# Patient Record
Sex: Female | Born: 1970 | Race: White | Hispanic: No | Marital: Married | Smoking: Current every day smoker
Health system: Southern US, Community
[De-identification: ages and names within clinical notes are randomized; demographics above are authoritative.]

## PROBLEM LIST (undated history)

## (undated) DIAGNOSIS — F419 Anxiety disorder, unspecified: Secondary | ICD-10-CM

## (undated) DIAGNOSIS — F32A Depression, unspecified: Secondary | ICD-10-CM

## (undated) DIAGNOSIS — N83209 Unspecified ovarian cyst, unspecified side: Secondary | ICD-10-CM

## (undated) DIAGNOSIS — F329 Major depressive disorder, single episode, unspecified: Secondary | ICD-10-CM

## (undated) DIAGNOSIS — B009 Herpesviral infection, unspecified: Secondary | ICD-10-CM

## (undated) HISTORY — DX: Unspecified ovarian cyst, unspecified side: N83.209

## (undated) HISTORY — DX: Major depressive disorder, single episode, unspecified: F32.9

## (undated) HISTORY — PX: BREAST BIOPSY: SHX20

## (undated) HISTORY — DX: Herpesviral infection, unspecified: B00.9

## (undated) HISTORY — DX: Depression, unspecified: F32.A

## (undated) HISTORY — DX: Anxiety disorder, unspecified: F41.9

---

## 2001-03-19 ENCOUNTER — Other Ambulatory Visit: Admission: RE | Admit: 2001-03-19 | Discharge: 2001-03-19 | Payer: Self-pay | Admitting: Gynecology

## 2002-05-12 ENCOUNTER — Other Ambulatory Visit: Admission: RE | Admit: 2002-05-12 | Discharge: 2002-05-12 | Payer: Self-pay | Admitting: Gynecology

## 2003-12-03 ENCOUNTER — Other Ambulatory Visit: Admission: RE | Admit: 2003-12-03 | Discharge: 2003-12-03 | Payer: Self-pay | Admitting: Gynecology

## 2004-12-07 ENCOUNTER — Other Ambulatory Visit: Admission: RE | Admit: 2004-12-07 | Discharge: 2004-12-07 | Payer: Self-pay | Admitting: Gynecology

## 2007-04-02 ENCOUNTER — Other Ambulatory Visit: Admission: RE | Admit: 2007-04-02 | Discharge: 2007-04-02 | Payer: Self-pay | Admitting: Gynecology

## 2008-08-06 ENCOUNTER — Other Ambulatory Visit: Admission: RE | Admit: 2008-08-06 | Discharge: 2008-08-06 | Payer: Self-pay | Admitting: Gynecology

## 2008-08-06 ENCOUNTER — Encounter: Payer: Self-pay | Admitting: Gynecology

## 2008-08-06 ENCOUNTER — Ambulatory Visit: Payer: Self-pay | Admitting: Gynecology

## 2008-08-31 ENCOUNTER — Encounter: Admission: RE | Admit: 2008-08-31 | Discharge: 2008-08-31 | Payer: Self-pay | Admitting: Internal Medicine

## 2009-01-18 ENCOUNTER — Encounter: Admission: RE | Admit: 2009-01-18 | Discharge: 2009-01-18 | Payer: Self-pay | Admitting: Internal Medicine

## 2010-08-04 ENCOUNTER — Other Ambulatory Visit (HOSPITAL_COMMUNITY)
Admission: RE | Admit: 2010-08-04 | Discharge: 2010-08-04 | Disposition: A | Discharge status: Home / Self Care | Payer: BC Managed Care – PPO | Source: Ambulatory Visit | Attending: Gynecology | Admitting: Gynecology

## 2010-08-04 ENCOUNTER — Encounter (INDEPENDENT_AMBULATORY_CARE_PROVIDER_SITE_OTHER): Payer: BC Managed Care – PPO | Admitting: Gynecology

## 2010-08-04 ENCOUNTER — Other Ambulatory Visit: Payer: Self-pay | Admitting: Gynecology

## 2010-08-04 DIAGNOSIS — Z01419 Encounter for gynecological examination (general) (routine) without abnormal findings: Secondary | ICD-10-CM

## 2010-08-04 DIAGNOSIS — R82998 Other abnormal findings in urine: Secondary | ICD-10-CM

## 2010-08-04 DIAGNOSIS — Z13220 Encounter for screening for lipoid disorders: Secondary | ICD-10-CM

## 2010-08-04 DIAGNOSIS — Z124 Encounter for screening for malignant neoplasm of cervix: Secondary | ICD-10-CM | POA: Insufficient documentation

## 2010-08-04 DIAGNOSIS — Z833 Family history of diabetes mellitus: Secondary | ICD-10-CM

## 2010-12-05 ENCOUNTER — Ambulatory Visit (INDEPENDENT_AMBULATORY_CARE_PROVIDER_SITE_OTHER): Payer: BC Managed Care – PPO | Admitting: Gynecology

## 2010-12-05 DIAGNOSIS — N644 Mastodynia: Secondary | ICD-10-CM

## 2011-07-13 ENCOUNTER — Ambulatory Visit (INDEPENDENT_AMBULATORY_CARE_PROVIDER_SITE_OTHER): Payer: BC Managed Care – PPO | Admitting: Gynecology

## 2011-07-13 ENCOUNTER — Encounter: Payer: Self-pay | Admitting: Gynecology

## 2011-07-13 DIAGNOSIS — F419 Anxiety disorder, unspecified: Secondary | ICD-10-CM

## 2011-07-13 DIAGNOSIS — N390 Urinary tract infection, site not specified: Secondary | ICD-10-CM

## 2011-07-13 DIAGNOSIS — R300 Dysuria: Secondary | ICD-10-CM

## 2011-07-13 DIAGNOSIS — E569 Vitamin deficiency, unspecified: Secondary | ICD-10-CM

## 2011-07-13 DIAGNOSIS — F411 Generalized anxiety disorder: Secondary | ICD-10-CM

## 2011-07-13 LAB — URINALYSIS W MICROSCOPIC + REFLEX CULTURE
Bilirubin Urine: NEGATIVE
Crystals: NONE SEEN
Glucose, UA: NEGATIVE mg/dL
Specific Gravity, Urine: 1.015 (ref 1.005–1.030)
Urobilinogen, UA: 0.2 mg/dL (ref 0.0–1.0)

## 2011-07-13 MED ORDER — NITROFURANTOIN MONOHYD MACRO 100 MG PO CAPS: 100.0000 mg | ORAL_CAPSULE | Freq: Two times a day (BID) | ORAL | Status: AC

## 2011-07-13 NOTE — Patient Instructions (Signed)
Take antibiotics as prescribed. Follow up if symptoms persist.

## 2011-07-13 NOTE — Progress Notes (Signed)
Patient presents with several days of worsening urinary frequency, dysuria and suprapubic discomfort. No fevers chills or low back pain. No other constitutional symptoms such as nausea vomiting diarrhea constipation.  Exam Spine straight without CVA tenderness. Abdomen soft nontender without masses guarding rebound organomegaly  Assessment and plan: Symptoms and urinalysis consistent with UTI. We'll treat with Macrobid 100 mg twice a day x7 days follow up if symptoms persist or recur. Packed patients actually due for her annual has it scheduled next month she'll follow up for that.

## 2011-07-16 LAB — URINE CULTURE

## 2011-08-14 ENCOUNTER — Ambulatory Visit (INDEPENDENT_AMBULATORY_CARE_PROVIDER_SITE_OTHER): Payer: BC Managed Care – PPO | Admitting: Gynecology

## 2011-08-14 ENCOUNTER — Encounter: Payer: Self-pay | Admitting: Gynecology

## 2011-08-14 VITALS — BP 124/70 | Ht 67.5 in | Wt 169.0 lb

## 2011-08-14 DIAGNOSIS — Z131 Encounter for screening for diabetes mellitus: Secondary | ICD-10-CM

## 2011-08-14 DIAGNOSIS — Z13220 Encounter for screening for lipoid disorders: Secondary | ICD-10-CM

## 2011-08-14 DIAGNOSIS — Z01419 Encounter for gynecological examination (general) (routine) without abnormal findings: Secondary | ICD-10-CM

## 2011-08-14 LAB — CBC WITH DIFFERENTIAL/PLATELET
Basophils Relative: 0 % (ref 0–1)
HCT: 39.3 % (ref 36.0–46.0)
Hemoglobin: 13.1 g/dL (ref 12.0–15.0)
Lymphocytes Relative: 29 % (ref 12–46)
Lymphs Abs: 2.4 10*3/uL (ref 0.7–4.0)
MCHC: 33.3 g/dL (ref 30.0–36.0)
Monocytes Absolute: 0.4 10*3/uL (ref 0.1–1.0)
Monocytes Relative: 5 % (ref 3–12)
Neutro Abs: 5.4 10*3/uL (ref 1.7–7.7)
RBC: 4.06 MIL/uL (ref 3.87–5.11)

## 2011-08-14 LAB — GLUCOSE, RANDOM: Glucose, Bld: 85 mg/dL (ref 70–99)

## 2011-08-14 LAB — LIPID PANEL
LDL Cholesterol: 151 mg/dL — ABNORMAL HIGH (ref 0–99)
VLDL: 13 mg/dL (ref 0–40)

## 2011-08-14 NOTE — Patient Instructions (Addendum)
Follow up with me if you want to discuss more reliable contraception. Return in one year for your annual gynecologic exam. Try to stop smoking.     Smoking Cessation This document explains the best ways for you to quit smoking and new treatments to help. It lists new medicines that can double or triple your chances of quitting and quitting for good. It also considers ways to avoid relapses and concerns you may have about quitting, including weight gain. NICOTINE: A POWERFUL ADDICTION If you have tried to quit smoking, you know how hard it can be. It is hard because nicotine is a very addictive drug. For some people, it can be as addictive as heroin or cocaine. Usually, people make 2 or 3 tries, or more, before finally being able to quit. Each time you try to quit, you can learn about what helps and what hurts. Quitting takes hard work and a lot of effort, but you can quit smoking. QUITTING SMOKING IS ONE OF THE MOST IMPORTANT THINGS YOU WILL EVER DO.  You will live longer, feel better, and live better.   The impact on your body of quitting smoking is felt almost immediately:   Within 20 minutes, blood pressure decreases. Pulse returns to its normal level.   After 8 hours, carbon monoxide levels in the blood return to normal. Oxygen level increases.   After 24 hours, chance of heart attack starts to decrease. Breath, hair, and body stop smelling like smoke.   After 48 hours, damaged nerve endings begin to recover. Sense of taste and smell improve.   After 72 hours, the body is virtually free of nicotine. Bronchial tubes relax and breathing becomes easier.   After 2 to 12 weeks, lungs can hold more air. Exercise becomes easier and circulation improves.   Quitting will reduce your risk of having a heart attack, stroke, cancer, or lung disease:   After 1 year, the risk of coronary heart disease is cut in half.   After 5 years, the risk of stroke falls to the same as a nonsmoker.    After 10 years, the risk of lung cancer is cut in half and the risk of other cancers decreases significantly.   After 15 years, the risk of coronary heart disease drops, usually to the level of a nonsmoker.   If you are pregnant, quitting smoking will improve your chances of having a healthy baby.   The people you live with, especially your children, will be healthier.   You will have extra money to spend on things other than cigarettes.  FIVE KEYS TO QUITTING Studies have shown that these 5 steps will help you quit smoking and quit for good. You have the best chances of quitting if you use them together: 1. Get ready.  2. Get support and encouragement.  3. Learn new skills and behaviors.  4. Get medicine to reduce your nicotine addiction and use it correctly.  5. Be prepared for relapse or difficult situations. Be determined to continue trying to quit, even if you do not succeed at first.  1. GET READY  Set a quit date.   Change your environment.   Get rid of ALL cigarettes, ashtrays, matches, and lighters in your home, car, and place of work.   Do not let people smoke in your home.   Review your past attempts to quit. Think about what worked and what did not.   Once you quit, do not smoke. NOT EVEN A PUFF!  2.  GET SUPPORT AND ENCOURAGEMENT Studies have shown that you have a better chance of being successful if you have help. You can get support in many ways.  Tell your family, friends, and coworkers that you are going to quit and need their support. Ask them not to smoke around you.   Talk to your caregivers (doctor, dentist, nurse, pharmacist, psychologist, and/or smoking counselor).   Get individual, group, or telephone counseling and support. The more counseling you have, the better your chances are of quitting. Programs are available at [employer] and health centers. Call your local health department for information about programs in your area.   Spiritual beliefs  and practices may help some smokers quit.   Quit meters are Photographer that keep track of quit statistics, such as amount of "quit-time," cigarettes not smoked, and money saved.   Many smokers find one or more of the many self-help books available useful in helping them quit and stay off tobacco.  3. LEARN NEW SKILLS AND BEHAVIORS  Try to distract yourself from urges to smoke. Talk to someone, go for a walk, or occupy your time with a task.   When you first try to quit, change your routine. Take a different route to work. Drink tea instead of coffee. Eat breakfast in a different place.   Do something to reduce your stress. Take a hot bath, exercise, or read a book.   Plan something enjoyable to do every day. Reward yourself for not smoking.   Explore interactive web-based programs that specialize in helping you quit.  4. GET MEDICINE AND USE IT CORRECTLY Medicines can help you stop smoking and decrease the urge to smoke. Combining medicine with the above behavioral methods and support can quadruple your chances of successfully quitting smoking. The [place] Food and Drug Administration (FDA) has approved 7 medicines to help you quit smoking. These medicines fall into 3 categories.  Nicotine replacement therapy (delivers nicotine to your body without the negative effects and risks of smoking):   Nicotine gum: Available over-the-counter.   Nicotine lozenges: Available over-the-counter.   Nicotine inhaler: Available by prescription.   Nicotine nasal spray: Available by prescription.   Nicotine skin patches (transdermal): Available by prescription and over-the-counter.   Antidepressant medicine (helps people abstain from smoking, but how this works is unknown):   Bupropion sustained-release (SR) tablets: Available by prescription.   Nicotinic receptor partial agonist (simulates the effect of nicotine in your brain):   Varenicline tartrate tablets:  Available by prescription.   Ask your caregiver for advice about which medicines to use and how to use them. Carefully read the information on the package.   Everyone who is trying to quit may benefit from using a medicine. If you are pregnant or trying to become pregnant, nursing an infant, you are under age 44, or you smoke fewer than 10 cigarettes per day, talk to your caregiver before taking any nicotine replacement medicines.   You should stop using a nicotine replacement product and call your caregiver if you experience nausea, dizziness, weakness, vomiting, fast or irregular heartbeat, mouth problems with the lozenge or gum, or redness or swelling of the skin around the patch that does not go away.   Do not use any other product containing nicotine while using a nicotine replacement product.   Talk to your caregiver before using these products if you have diabetes, heart disease, asthma, stomach ulcers, you had a recent heart attack, you have high blood pressure  that is not controlled with medicine, a history of irregular heartbeat, or you have been prescribed medicine to help you quit smoking.  5. BE PREPARED FOR RELAPSE OR DIFFICULT SITUATIONS  Most relapses occur within the first 3 months after quitting. Do not be discouraged if you start smoking again. Remember, most people try several times before they finally quit.   You may have symptoms of withdrawal because your body is used to nicotine. You may crave cigarettes, be irritable, feel very hungry, cough often, get headaches, or have difficulty concentrating.   The withdrawal symptoms are only temporary. They are strongest when you first quit, but they will go away within 10 to 14 days.  Here are some difficult situations to watch for:  Alcohol. Avoid drinking alcohol. Drinking lowers your chances of successfully quitting.   Caffeine. Try to reduce the amount of caffeine you consume. It also lowers your chances of successfully  quitting.   Other smokers. Being around smoking can make you want to smoke. Avoid smokers.   Weight gain. Many smokers will gain weight when they quit, usually less than 10 pounds. Eat a healthy diet and stay active. Do not let weight gain distract you from your main goal, quitting smoking. Some medicines that help you quit smoking may also help delay weight gain. You can always lose the weight gained after you quit.   Bad mood or depression. There are a lot of ways to improve your mood other than smoking.  If you are having problems with any of these situations, talk to your caregiver. SPECIAL SITUATIONS AND CONDITIONS Studies suggest that everyone can quit smoking. Your situation or condition can give you a special reason to quit.  Pregnant women/new mothers: By quitting, you protect your baby's health and your own.   Hospitalized patients: By quitting, you reduce health problems and help healing.   Heart attack patients: By quitting, you reduce your risk of a second heart attack.   Lung, head, and neck cancer patients: By quitting, you reduce your chance of a second cancer.   Parents of children and adolescents: By quitting, you protect your children from illnesses caused by secondhand smoke.  QUESTIONS TO THINK ABOUT Think about the following questions before you try to stop smoking. You may want to talk about your answers with your caregiver.  Why do you want to quit?   If you tried to quit in the past, what helped and what did not?   What will be the most difficult situations for you after you quit? How will you plan to handle them?   Who can help you through the tough times? Your family? Friends? Caregiver?   What pleasures do you get from smoking? What ways can you still get pleasure if you quit?  Here are some questions to ask your caregiver:  How can you help me to be successful at quitting?   What medicine do you think would be best for me and how should I take it?    What should I do if I need more help?   What is smoking withdrawal like? How can I get information on withdrawal?  Quitting takes hard work and a lot of effort, but you can quit smoking. FOR MORE INFORMATION  Smokefree.gov (http://www.davis-sullivan.com/) provides free, accurate, evidence-based information and professional assistance to help support the immediate and long-term needs of people trying to quit smoking. Document Released: 05/08/2001 Document Revised: 05/03/2011 Document Reviewed: 02/28/2009 [facility] Patient Information 2012 [place], [place].

## 2011-08-14 NOTE — Progress Notes (Signed)
Connie Fox 1971-01-27 [id number]        41 y.o.  for annual exam.  Several issues that are below.  Past medical history,surgical history, medications, allergies, family history and social history were all reviewed and documented in the EPIC chart. ROS:  Was performed and pertinent positives and negatives are included in the history.  Exam: Connie Fox chaperone present Filed Vitals:   08/14/11 0932  BP: 124/70   General appearance  Normal Skin grossly normal Head/Neck normal with no cervical or supraclavicular adenopathy thyroid normal Lungs  clear Cardiac RR, without RMG Abdominal  soft, nontender, without masses, organomegaly or hernia Breasts  examined lying and sitting without masses, retractions, discharge or axillary adenopathy. Pelvic  Ext/BUS/vagina  normal   Cervix  normal  Uterus  anteverted, normal size, shape and contour, midline and mobile nontender   Adnexa  Without masses or tenderness    Anus and perineum  normal   Rectovaginal  normal sphincter tone without palpated masses or tenderness.    Assessment/Plan:  41 y.o. female for annual exam.   Regular menses, sexually active with no active birth control. 1. Contraception. I again discussed contraceptive options with her. The issues of pregnancy in 41 year old have been reviewed. She is comfortable not actively using contraception would accept pregnancy if it occurs. 2. Breast health. Does have a maternal history of breast cancer. Due for mammogram now and she knows to schedule this and agrees to do so and continue with annual mammography. SBE monthly reviewed. 3. Pap smear. She has no history of significant abnormal Pap smears before. No Pap smear was done today. She has multiple normal records in her chart with her last Pap 2012. We'll plan on every three-year screening per current screening guidelines and she agrees with this. 4. Smoking. I again discussed stop smoking with her. I reviewed various strategies with her and she  understands my recommendations. We discussed assistance such as medication and she declines at this time. 5. Health maintenance. Baseline CBC glucose lipid profile and urinalysis were ordered. Assuming she continues well from a gynecologic standpoint she will see me in a year, sooner as needed.    Connie Lords MD, 10:23 AM 08/14/2011

## 2011-08-15 ENCOUNTER — Other Ambulatory Visit: Payer: Self-pay | Admitting: *Deleted

## 2011-08-15 DIAGNOSIS — E7800 Pure hypercholesterolemia, unspecified: Secondary | ICD-10-CM

## 2011-08-15 LAB — URINALYSIS W MICROSCOPIC + REFLEX CULTURE
Bilirubin Urine: NEGATIVE
Casts: NONE SEEN
Crystals: NONE SEEN
Glucose, UA: NEGATIVE mg/dL
Leukocytes, UA: NEGATIVE
Specific Gravity, Urine: 1.021 (ref 1.005–1.030)
Squamous Epithelial / LPF: NONE SEEN
pH: 5.5 (ref 5.0–8.0)

## 2011-08-20 ENCOUNTER — Ambulatory Visit: Payer: BC Managed Care – PPO

## 2011-08-20 DIAGNOSIS — E7800 Pure hypercholesterolemia, unspecified: Secondary | ICD-10-CM

## 2011-08-21 LAB — LIPID PANEL
HDL: 46 mg/dL (ref >39)
LDL Cholesterol: 134 mg/dL — ABNORMAL HIGH (ref 0–99)
Triglycerides: 73 mg/dL (ref <150)

## 2012-02-08 ENCOUNTER — Encounter: Payer: Self-pay | Admitting: Gynecology

## 2012-02-12 ENCOUNTER — Encounter: Payer: Self-pay | Admitting: Gynecology

## 2012-05-02 ENCOUNTER — Ambulatory Visit (INDEPENDENT_AMBULATORY_CARE_PROVIDER_SITE_OTHER): Payer: BC Managed Care – PPO | Admitting: Gynecology

## 2012-05-02 ENCOUNTER — Encounter: Payer: Self-pay | Admitting: Gynecology

## 2012-05-02 DIAGNOSIS — N926 Irregular menstruation, unspecified: Secondary | ICD-10-CM

## 2012-05-02 DIAGNOSIS — E7800 Pure hypercholesterolemia, unspecified: Secondary | ICD-10-CM

## 2012-05-02 LAB — HCG, SERUM, QUALITATIVE: Preg, Serum: NEGATIVE

## 2012-05-02 LAB — CBC WITH DIFFERENTIAL/PLATELET
Basophils Relative: 1 % (ref 0–1)
Eosinophils Absolute: 0.1 10*3/uL (ref 0.0–0.7)
Hemoglobin: 13.3 g/dL (ref 12.0–15.0)
Lymphs Abs: 2.5 10*3/uL (ref 0.7–4.0)
MCH: 33.5 pg (ref 26.0–34.0)
Neutrophils Relative %: 65 % (ref 43–77)
Platelets: 298 10*3/uL (ref 150–400)

## 2012-05-02 LAB — LIPID PANEL
LDL Cholesterol: 124 mg/dL — ABNORMAL HIGH (ref 0–99)
Triglycerides: 66 mg/dL (ref <150)

## 2012-05-02 NOTE — Patient Instructions (Signed)
Follow up for ultrasound as scheduled 

## 2012-05-02 NOTE — Progress Notes (Signed)
Patient presents having regular menses until October when her period was 5-6 days late. She then started a regular flow and then has bled on and off since then. No cramping or pain. Did receive a steroid injection in the shoulder. No weight gain hair or skin changes or galactorrhea. Not contracepting and sexually active.  Exam was [name] assistant Abdomen soft nontender without masses guarding rebound organomegaly. Pelvic external BUS vagina normal. Cervix normal. Uterus normal size midline mobile nontender. Adnexa without masses or tenderness.  Assessment and plan: Irregular menses x1 month. Check baseline labs to include CBC hCG and TSH.  Follow up for sonohysterogram. Various possibilities to include hormonal dysfunction, structural abnormalities such as polyps/myomas as well as pregnancy related events all reviewed.

## 2012-05-14 ENCOUNTER — Encounter: Payer: Self-pay | Admitting: Gynecology

## 2012-05-14 ENCOUNTER — Ambulatory Visit (INDEPENDENT_AMBULATORY_CARE_PROVIDER_SITE_OTHER): Payer: BC Managed Care – PPO | Admitting: Gynecology

## 2012-05-14 ENCOUNTER — Ambulatory Visit: Payer: BC Managed Care – PPO | Admitting: Gynecology

## 2012-05-14 ENCOUNTER — Other Ambulatory Visit: Payer: BC Managed Care – PPO

## 2012-05-14 ENCOUNTER — Ambulatory Visit (INDEPENDENT_AMBULATORY_CARE_PROVIDER_SITE_OTHER): Payer: BC Managed Care – PPO

## 2012-05-14 DIAGNOSIS — N926 Irregular menstruation, unspecified: Secondary | ICD-10-CM

## 2012-05-14 DIAGNOSIS — N920 Excessive and frequent menstruation with regular cycle: Secondary | ICD-10-CM

## 2012-05-14 DIAGNOSIS — D259 Leiomyoma of uterus, unspecified: Secondary | ICD-10-CM

## 2012-05-14 DIAGNOSIS — D251 Intramural leiomyoma of uterus: Secondary | ICD-10-CM

## 2012-05-14 DIAGNOSIS — N83209 Unspecified ovarian cyst, unspecified side: Secondary | ICD-10-CM

## 2012-05-14 DIAGNOSIS — O34519 Maternal care for incarceration of gravid uterus, unspecified trimester: Secondary | ICD-10-CM

## 2012-05-14 DIAGNOSIS — N854 Malposition of uterus: Secondary | ICD-10-CM

## 2012-05-14 NOTE — Patient Instructions (Signed)
Office will call you with the biopsy results. Keep a menstrual calendar. If significant irregularity call me. Otherwise follow up for ultrasound in 2-3 months to follow up on your ovarian cyst.

## 2012-05-14 NOTE — Progress Notes (Signed)
Patient presents for sonohysterogram due to irregular menses.  History of regular menses until October when her period was 5-6 days late. She then started a regular flow and then has bled on and off since then. No cramping or pain. Did receive a steroid injection in the shoulder. No weight gain hair or skin changes or galactorrhea. Not contracepting and sexually active. Lab work includes a negative hCG normal TSH and hemoglobin of 13.  Ultrasound shows multiple small myomas largest 15 mm. Overall uterine size normal. Endometrial echo 19 mm. Right ovary with avascular 33 mm mean cystic area. Left ovary normal. Cul-de-sac negative. Sonohysterogram performed, sterile technique, easy catheter introduction, good distention with no abnormalities. Endometrial sample taken. Patient tolerated well.  Assessment and plan: 1. Small leiomyoma. Discussed with patient and I think these are asymptomatic and we will plan on continuing to monitor. 2. Right ovarian cyst. 33 mm with a cyst within a cyst appearance. Avascular. Discussed with patient probably physiologic. Did recommend follow up ultrasound in 2-3 months to make sure it resolves. Review possibilities to include surgery if it persists or enlarges. 3. Irregular menses. I think this is probably hormonal related. Possibly due to the cyst. Follow up on biopsy. Assuming negative than will keep menstrual calendar and we'll see how she does. If irregular bleeding continues then consider hormone manipulation.

## 2012-05-28 HISTORY — PX: AUGMENTATION MAMMAPLASTY: SUR837

## 2012-07-12 ENCOUNTER — Other Ambulatory Visit: Payer: Self-pay

## 2012-07-28 ENCOUNTER — Encounter: Payer: Self-pay | Admitting: Gynecology

## 2012-07-28 ENCOUNTER — Ambulatory Visit (INDEPENDENT_AMBULATORY_CARE_PROVIDER_SITE_OTHER): Payer: BC Managed Care – PPO | Admitting: Gynecology

## 2012-07-28 ENCOUNTER — Ambulatory Visit (INDEPENDENT_AMBULATORY_CARE_PROVIDER_SITE_OTHER): Payer: BC Managed Care – PPO

## 2012-07-28 DIAGNOSIS — N83209 Unspecified ovarian cyst, unspecified side: Secondary | ICD-10-CM

## 2012-07-28 NOTE — Progress Notes (Signed)
Patient also for ultrasound. Had an episode of irregular bleeding which she now notes that her menses are regular and a 34 mm simple cyst on the right ovary 05/14/2012. I asked her to come back in several months for re\re ultrasound and make sure this resolves and to followup on her menstrual history.  Ultrasound today is normal. She still has a small leiomyoma stable. Endometrial echo 7.8 mm. Right left ovaries visualized and normal with no evidence of the prior cyst.  Assessment and plan: Irregular menses now regular, small right ovarian cyst now resolved. Patient due for her annual exam now and I asked her to schedule this over the next several months in followup and she agrees to do so.

## 2012-07-28 NOTE — Patient Instructions (Signed)
Followup for annual exam over the next several months

## 2012-08-08 ENCOUNTER — Other Ambulatory Visit: Payer: Self-pay | Admitting: *Deleted

## 2012-08-08 DIAGNOSIS — R921 Mammographic calcification found on diagnostic imaging of breast: Secondary | ICD-10-CM

## 2012-08-20 ENCOUNTER — Other Ambulatory Visit: Payer: Self-pay | Admitting: *Deleted

## 2012-08-20 DIAGNOSIS — R897 Abnormal histological findings in specimens from other organs, systems and tissues: Secondary | ICD-10-CM

## 2012-08-21 ENCOUNTER — Other Ambulatory Visit: Payer: Self-pay | Admitting: Gynecology

## 2012-08-21 DIAGNOSIS — R921 Mammographic calcification found on diagnostic imaging of breast: Secondary | ICD-10-CM

## 2012-08-25 ENCOUNTER — Other Ambulatory Visit: Payer: Self-pay | Admitting: Radiology

## 2012-08-26 ENCOUNTER — Other Ambulatory Visit: Payer: Self-pay | Admitting: *Deleted

## 2012-08-26 DIAGNOSIS — R897 Abnormal histological findings in specimens from other organs, systems and tissues: Secondary | ICD-10-CM

## 2013-02-18 ENCOUNTER — Encounter: Payer: Self-pay | Admitting: Gynecology

## 2013-03-17 ENCOUNTER — Ambulatory Visit (INDEPENDENT_AMBULATORY_CARE_PROVIDER_SITE_OTHER): Payer: BC Managed Care – PPO | Admitting: Gynecology

## 2013-03-17 ENCOUNTER — Encounter: Payer: Self-pay | Admitting: Gynecology

## 2013-03-17 VITALS — BP 144/84 | Ht 67.0 in | Wt 174.0 lb

## 2013-03-17 DIAGNOSIS — Z309 Encounter for contraceptive management, unspecified: Secondary | ICD-10-CM

## 2013-03-17 DIAGNOSIS — Z01419 Encounter for gynecological examination (general) (routine) without abnormal findings: Secondary | ICD-10-CM

## 2013-03-17 DIAGNOSIS — Z13220 Encounter for screening for lipoid disorders: Secondary | ICD-10-CM

## 2013-03-17 LAB — LIPID PANEL
HDL: 49 mg/dL (ref >39)
Triglycerides: 96 mg/dL (ref <150)

## 2013-03-17 LAB — CBC WITH DIFFERENTIAL/PLATELET
HCT: 37.8 % (ref 36.0–46.0)
Hemoglobin: 12.8 g/dL (ref 12.0–15.0)
Lymphocytes Relative: 34 % (ref 12–46)
Lymphs Abs: 3.3 10*3/uL (ref 0.7–4.0)
Monocytes Absolute: 0.7 10*3/uL (ref 0.1–1.0)
Monocytes Relative: 7 % (ref 3–12)
Neutro Abs: 5.7 10*3/uL (ref 1.7–7.7)
RBC: 3.88 MIL/uL (ref 3.87–5.11)
WBC: 9.7 10*3/uL (ref 4.0–10.5)

## 2013-03-17 LAB — COMPREHENSIVE METABOLIC PANEL
Albumin: 4.3 g/dL (ref 3.5–5.2)
BUN: 10 mg/dL (ref 6–23)
Calcium: 9.2 mg/dL (ref 8.4–10.5)
Chloride: 102 mEq/L (ref 96–112)
Glucose, Bld: 84 mg/dL (ref 70–99)
Potassium: 3.8 mEq/L (ref 3.5–5.3)
Total Protein: 7.1 g/dL (ref 6.0–8.3)

## 2013-03-17 NOTE — Progress Notes (Signed)
Connie Fox [birth year] [id number]        42 y.o.  G2P0020 for annual exam.  Doing well without complaints.  Past medical history,surgical history, medications, allergies, family history and social history were all reviewed and documented in the EPIC chart.  ROS:  Performed and pertinent positives and negatives are included in the history, assessment and plan .  Exam: Kim assistant Filed Vitals:   03/17/13 1155  BP: 144/84  Height: 5\' 7"  (1.702 m)  Weight: 174 lb (78.926 kg)   General appearance  Normal Skin grossly normal Head/Neck normal with no cervical or supraclavicular adenopathy thyroid normal Lungs  clear Cardiac RR, without RMG Abdominal  soft, nontender, without masses, organomegaly or hernia Breasts  examined lying and sitting without masses, retractions, discharge or axillary adenopathy. Pelvic  Ext/BUS/vagina  normal  Cervix  normal  Uterus  anteverted, normal size, shape and contour, midline and mobile nontender   Adnexa  Without masses or tenderness    Anus and perineum  normal   Rectovaginal  normal sphincter tone without palpated masses or tenderness.    Assessment/Plan:  42 y.o. G19P0020 female for annual exam, regular menses, no contraception.  1. Contraceptive management. Reviewed contraceptive options to include progesterone only such as Depo-Provera and nexplanon. Mirena IUD as well as other IUDs discussed. I reviewed was involved with Mirena IUD placement and the risks to include perforation/migration requiring surgery to remove, infection, failure with pregnancy, hormonal side effects. Patient's going to think about it and followup if she decides. Otherwise if she continues without contraception she understands and accepts the failure risk with pregnancy. 2. Mammography 01/2013. Mother with postmenopausal breast cancer. Continue with annual mammography. SBE monthly reviewed. 3. Pap smear 2012. No Pap smear done today. No history of abnormal Pap smears previously.  Repeat Pap smear next year a 3 year interval. 4. Mildly elevated blood pressure 144/84. No history of same before. Is on a decongestant for sinuses. Recommended repeat monitoring in a non-exam situation. If continues elevated the need for followup and possible treatment discussed and understood. 5. Stop smoking again discussed and encouraged with strategies reviewed. 6. Health maintenance. Baseline CBC comprehensive metabolic panel lipid profile urinalysis ordered. Followup with contraceptive decision otherwise annually  Note: This document was prepared with digital dictation and possible smart phrase technology. Any transcriptional errors that result from this process are unintentional.   [name], 12:45 PM 03/17/2013

## 2013-03-17 NOTE — Patient Instructions (Addendum)
Followup for IUD placement if you decide to do so. Followup in one year for annual exam.  Consider Stop Smoking.  Help is available at [facility] smoking cessation program @ www.[address].com or 443-601-4447. OR 1-800-QUIT-NOW 717-145-7642) for free smoking cessation counseling.  Smokefree.gov (http://www.[place].com/) provides free, accurate, evidence-based information and professional assistance to help support the immediate and long-term needs of people trying to quit smoking.    Smoking Hazards Smoking cigarettes is extremely bad for your health. Tobacco smoke has over 200 known poisons in it. There are over 60 chemicals in tobacco smoke that cause cancer. Some of the chemicals found in cigarette smoke include:  Cyanide.  Benzene.  Formaldehyde.  Methanol (wood alcohol).  Acetylene (fuel used in welding torches).  Ammonia.  Cigarette smoke also contains the poisonous gases nitrogen oxide and carbon monoxide.  Cigarette smokers have an increased risk of many serious medical problems, including: Lung cancer.  Lung disease (such as pneumonia, bronchitis, and emphysema).  Heart attack and chest pain due to the heart not getting enough oxygen (angina).  Heart disease and peripheral blood vessel disease.  Hypertension.  Stroke.  Oral cancer (cancer of the lip, mouth, or voice box).  Bladder cancer.  Pancreatic cancer.  Cervical cancer.  Pregnancy complications, including premature birth.  Low birthweight babies.  Early menopause.  Lower estrogen level for women.  Infertility.  Facial wrinkles.  Blindness.  Increased risk of broken bones (fractures).  Senile dementia.  Stillbirths and smaller newborn babies, birth defects, and genetic damage to sperm.  Stomach ulcers and internal bleeding.  Children of smokers have an increased risk of the following, because of secondhand smoke exposure:  Sudden infant death syndrome (SIDS).  Respiratory infections.  Lung cancer.   Heart disease.  Ear infections.  Smoking causes approximately: 90% of all lung cancer deaths in men.  80% of all lung cancer deaths in women.  90% of deaths from chronic obstructive lung disease.  Compared with nonsmokers, smoking increases the risk of: Coronary heart disease by 2 to 4 times.  Stroke by 2 to 4 times.  Men developing lung cancer by 23 times.  Women developing lung cancer by 13 times.  Dying from chronic obstructive lung diseases by 12 times.  Someone who smokes 2 packs a day loses about 8 years of his or her life. Even smoking lightly shortens your life expectancy by several years. You can greatly reduce the risk of medical problems for you and your family by stopping now. Smoking is the most preventable cause of death and disease in our society. Within days of quitting smoking, your circulation returns to normal, you decrease the risk of having a heart attack, and your lung capacity improves. There may be some increased phlegm in the first few days after quitting, and it may take months for your lungs to clear up completely. Quitting for 10 years cuts your lung cancer risk to almost that of a nonsmoker. WHY IS SMOKING ADDICTIVE? Nicotine is the chemical agent in tobacco that is capable of causing addiction or dependence.  When you smoke and inhale, nicotine is absorbed rapidly into the bloodstream through your lungs. Nicotine absorbed through the lungs is capable of creating a powerful addiction. Both inhaled and non-inhaled nicotine may be addictive.  Addiction studies of cigarettes and spit tobacco show that addiction to nicotine occurs mainly during the teen years, when young people begin using tobacco products.  WHAT ARE THE BENEFITS OF QUITTING?  There are many health benefits to  quitting smoking.  Likelihood of developing cancer and heart disease decreases. Health improvements are seen almost immediately.  Blood pressure, pulse rate, and breathing patterns start returning  to normal soon after quitting.  People who quit may see an improvement in their overall quality of life.  Some people choose to quit all at once. Other options include nicotine replacement products, such as patches, gum, and nasal sprays. Do not use these products without first checking with your caregiver. QUITTING SMOKING It is not easy to quit smoking. Nicotine is addicting, and longtime habits are hard to change. To start, you can write down all your reasons for quitting, tell your family and friends you want to quit, and ask for their help. Throw your cigarettes away, chew gum or cinnamon sticks, keep your hands busy, and drink extra water or juice. Go for walks and practice deep breathing to relax. Think of all the money you are saving: around $1,000 a year, for the average pack-a-day smoker. Nicotine patches and gum have been shown to improve success at efforts to stop smoking. Zyban (bupropion) is an anti-depressant drug that can be prescribed to reduce nicotine withdrawal symptoms and to suppress the urge to smoke. Smoking is an addiction with both physical and psychological effects. Joining a stop-smoking support group can help you cope with the emotional issues. For more information and advice on programs to stop smoking, call your doctor, your local hospital, or these organizations: American Lung Association - 1-800-LUNGUSA   Smoking Cessation  This document explains the best ways for you to quit smoking and new treatments to help. It lists new medicines that can double or triple your chances of quitting and quitting for good. It also considers ways to avoid relapses and concerns you may have about quitting, including weight gain. NICOTINE: A POWERFUL ADDICTION If you have tried to quit smoking, you know how hard it can be. It is hard because nicotine is a very addictive drug. For some people, it can be as addictive as heroin or cocaine. Usually, people make 2 or 3 tries, or more, before  finally being able to quit. Each time you try to quit, you can learn about what helps and what hurts. Quitting takes hard work and a lot of effort, but you can quit smoking. QUITTING SMOKING IS ONE OF THE MOST IMPORTANT THINGS YOU WILL EVER DO.  You will live longer, feel better, and live better.   The impact on your body of quitting smoking is felt almost immediately:   Within 20 minutes, blood pressure decreases. Pulse returns to its normal level.   After 8 hours, carbon monoxide levels in the blood return to normal. Oxygen level increases.   After 24 hours, chance of heart attack starts to decrease. Breath, hair, and body stop smelling like smoke.   After 48 hours, damaged nerve endings begin to recover. Sense of taste and smell improve.   After 72 hours, the body is virtually free of nicotine. Bronchial tubes relax and breathing becomes easier.   After 2 to 12 weeks, lungs can hold more air. Exercise becomes easier and circulation improves.   Quitting will reduce your risk of having a heart attack, stroke, cancer, or lung disease:   After 1 year, the risk of coronary heart disease is cut in half.   After 5 years, the risk of stroke falls to the same as a nonsmoker.   After 10 years, the risk of lung cancer is cut in half and the risk  of other cancers decreases significantly.   After 15 years, the risk of coronary heart disease drops, usually to the level of a nonsmoker.   If you are pregnant, quitting smoking will improve your chances of having a healthy baby.   The people you live with, especially your children, will be healthier.   You will have extra money to spend on things other than cigarettes.  FIVE KEYS TO QUITTING Studies have shown that these 5 steps will help you quit smoking and quit for good. You have the best chances of quitting if you use them together: 1. Get ready.  2. Get support and encouragement.  3. Learn new skills and behaviors.  4. Get medicine to  reduce your nicotine addiction and use it correctly.  5. Be prepared for relapse or difficult situations. Be determined to continue trying to quit, even if you do not succeed at first.  1. GET READY  Set a quit date.   Change your environment.   Get rid of ALL cigarettes, ashtrays, matches, and lighters in your home, car, and place of work.   Do not let people smoke in your home.   Review your past attempts to quit. Think about what worked and what did not.   Once you quit, do not smoke. NOT EVEN A PUFF!  2. GET SUPPORT AND ENCOURAGEMENT Studies have shown that you have a better chance of being successful if you have help. You can get support in many ways.  Tell your family, friends, and coworkers that you are going to quit and need their support. Ask them not to smoke around you.   Talk to your caregivers (doctor, dentist, nurse, pharmacist, psychologist, and/or smoking counselor).   Get individual, group, or telephone counseling and support. The more counseling you have, the better your chances are of quitting. Programs are available at [employer] and health centers. Call your local health department for information about programs in your area.   Spiritual beliefs and practices may help some smokers quit.   Quit meters are Photographer that keep track of quit statistics, such as amount of "quit-time," cigarettes not smoked, and money saved.   Many smokers find one or more of the many self-help books available useful in helping them quit and stay off tobacco.  3. LEARN NEW SKILLS AND BEHAVIORS  Try to distract yourself from urges to smoke. Talk to someone, go for a walk, or occupy your time with a task.   When you first try to quit, change your routine. Take a different route to work. Drink tea instead of coffee. Eat breakfast in a different place.   Do something to reduce your stress. Take a hot bath, exercise, or read a book.   Plan  something enjoyable to do every day. Reward yourself for not smoking.   Explore interactive web-based programs that specialize in helping you quit.  4. GET MEDICINE AND USE IT CORRECTLY Medicines can help you stop smoking and decrease the urge to smoke. Combining medicine with the above behavioral methods and support can quadruple your chances of successfully quitting smoking. The [place] Food and Drug Administration (FDA) has approved 7 medicines to help you quit smoking. These medicines fall into 3 categories.  Nicotine replacement therapy (delivers nicotine to your body without the negative effects and risks of smoking):   Nicotine gum: Available over-the-counter.   Nicotine lozenges: Available over-the-counter.   Nicotine inhaler: Available by prescription.   Nicotine nasal spray:  Available by prescription.   Nicotine skin patches (transdermal): Available by prescription and over-the-counter.   Antidepressant medicine (helps people abstain from smoking, but how this works is unknown):   Bupropion sustained-release (SR) tablets: Available by prescription.   Nicotinic receptor partial agonist (simulates the effect of nicotine in your brain):   Varenicline tartrate tablets: Available by prescription.   Ask your caregiver for advice about which medicines to use and how to use them. Carefully read the information on the package.   Everyone who is trying to quit may benefit from using a medicine. If you are pregnant or trying to become pregnant, nursing an infant, you are under age 91, or you smoke fewer than 10 cigarettes per day, talk to your caregiver before taking any nicotine replacement medicines.   You should stop using a nicotine replacement product and call your caregiver if you experience nausea, dizziness, weakness, vomiting, fast or irregular heartbeat, mouth problems with the lozenge or gum, or redness or swelling of the skin around the patch that does not go away.   Do not  use any other product containing nicotine while using a nicotine replacement product.   Talk to your caregiver before using these products if you have diabetes, heart disease, asthma, stomach ulcers, you had a recent heart attack, you have high blood pressure that is not controlled with medicine, a history of irregular heartbeat, or you have been prescribed medicine to help you quit smoking.  5. BE PREPARED FOR RELAPSE OR DIFFICULT SITUATIONS  Most relapses occur within the first 3 months after quitting. Do not be discouraged if you start smoking again. Remember, most people try several times before they finally quit.   You may have symptoms of withdrawal because your body is used to nicotine. You may crave cigarettes, be irritable, feel very hungry, cough often, get headaches, or have difficulty concentrating.   The withdrawal symptoms are only temporary. They are strongest when you first quit, but they will go away within 10 to 14 days.  Here are some difficult situations to watch for:  Alcohol. Avoid drinking alcohol. Drinking lowers your chances of successfully quitting.   Caffeine. Try to reduce the amount of caffeine you consume. It also lowers your chances of successfully quitting.   Other smokers. Being around smoking can make you want to smoke. Avoid smokers.   Weight gain. Many smokers will gain weight when they quit, usually less than 10 pounds. Eat a healthy diet and stay active. Do not let weight gain distract you from your main goal, quitting smoking. Some medicines that help you quit smoking may also help delay weight gain. You can always lose the weight gained after you quit.   Bad mood or depression. There are a lot of ways to improve your mood other than smoking.  If you are having problems with any of these situations, talk to your caregiver. SPECIAL SITUATIONS AND CONDITIONS Studies suggest that everyone can quit smoking. Your situation or condition can give you a special  reason to quit.  Pregnant women/new mothers: By quitting, you protect your baby's health and your own.   Hospitalized patients: By quitting, you reduce health problems and help healing.   Heart attack patients: By quitting, you reduce your risk of a second heart attack.   Lung, head, and neck cancer patients: By quitting, you reduce your chance of a second cancer.   Parents of children and adolescents: By quitting, you protect your children from illnesses caused by secondhand smoke.  QUESTIONS TO THINK ABOUT Think about the following questions before you try to stop smoking. You may want to talk about your answers with your caregiver.  Why do you want to quit?   If you tried to quit in the past, what helped and what did not?   What will be the most difficult situations for you after you quit? How will you plan to handle them?   Who can help you through the tough times? Your family? Friends? Caregiver?   What pleasures do you get from smoking? What ways can you still get pleasure if you quit?  Here are some questions to ask your caregiver:  How can you help me to be successful at quitting?   What medicine do you think would be best for me and how should I take it?   What should I do if I need more help?   What is smoking withdrawal like? How can I get information on withdrawal?  Quitting takes hard work and a lot of effort, but you can quit smoking.

## 2013-03-18 LAB — URINALYSIS W MICROSCOPIC + REFLEX CULTURE
Casts: NONE SEEN
Crystals: NONE SEEN
Glucose, UA: NEGATIVE mg/dL
Hgb urine dipstick: NEGATIVE
Ketones, ur: NEGATIVE mg/dL
Leukocytes, UA: NEGATIVE
Specific Gravity, Urine: 1.012 (ref 1.005–1.030)
pH: 5.5 (ref 5.0–8.0)

## 2013-04-02 ENCOUNTER — Other Ambulatory Visit: Payer: Self-pay

## 2014-03-29 ENCOUNTER — Encounter: Payer: Self-pay | Admitting: Gynecology

## 2014-04-28 ENCOUNTER — Ambulatory Visit
Admission: RE | Admit: 2014-04-28 | Discharge: 2014-04-28 | Disposition: A | Discharge status: Home / Self Care | Payer: BC Managed Care – PPO | Source: Ambulatory Visit | Attending: Internal Medicine | Admitting: Internal Medicine

## 2014-04-28 ENCOUNTER — Other Ambulatory Visit: Payer: Self-pay | Admitting: Internal Medicine

## 2014-04-28 DIAGNOSIS — R05 Cough: Secondary | ICD-10-CM

## 2014-04-28 DIAGNOSIS — R059 Cough, unspecified: Secondary | ICD-10-CM

## 2014-05-07 ENCOUNTER — Encounter: Payer: Self-pay | Admitting: Gynecology

## 2014-06-15 ENCOUNTER — Encounter: Payer: BC Managed Care – PPO | Admitting: Gynecology

## 2014-06-23 ENCOUNTER — Ambulatory Visit (INDEPENDENT_AMBULATORY_CARE_PROVIDER_SITE_OTHER): Payer: BC Managed Care – PPO | Admitting: Gynecology

## 2014-06-23 ENCOUNTER — Encounter: Payer: Self-pay | Admitting: Gynecology

## 2014-06-23 ENCOUNTER — Other Ambulatory Visit (HOSPITAL_COMMUNITY)
Admission: RE | Admit: 2014-06-23 | Discharge: 2014-06-23 | Disposition: A | Discharge status: Home / Self Care | Payer: BC Managed Care – PPO | Source: Ambulatory Visit | Attending: Gynecology | Admitting: Gynecology

## 2014-06-23 VITALS — BP 120/76 | Ht 67.0 in | Wt 185.0 lb

## 2014-06-23 DIAGNOSIS — Z01419 Encounter for gynecological examination (general) (routine) without abnormal findings: Secondary | ICD-10-CM

## 2014-06-23 DIAGNOSIS — Z1151 Encounter for screening for human papillomavirus (HPV): Secondary | ICD-10-CM | POA: Diagnosis present

## 2014-06-23 DIAGNOSIS — Z309 Encounter for contraceptive management, unspecified: Secondary | ICD-10-CM

## 2014-06-23 NOTE — Progress Notes (Signed)
Connie Fox [birth year] [id number]        44 y.o.  G2P0020 for annual exam.  Several issues noted below.  Past medical history,surgical history, problem list, medications, allergies, family history and social history were all reviewed and documented as reviewed in the EPIC chart.  ROS:  Performed with pertinent positives and negatives included in the history, assessment and plan.   Additional significant findings :  none   Exam: Connie Fox Vitals:   06/23/14 1357  BP: 120/76  Height: 5\' 7"  (1.702 m)  Weight: 185 lb (83.915 kg)   General appearance:  Normal affect, orientation and appearance. Skin: Grossly normal HEENT: Without gross lesions.  No cervical or supraclavicular adenopathy. Thyroid normal.  Lungs:  Clear without wheezing, rales or rhonchi Cardiac: RR, without RMG Abdominal:  Soft, nontender, without masses, guarding, rebound, organomegaly or hernia Breasts:  Examined lying and sitting without masses, retractions, discharge or axillary adenopathy.  Bilateral implants noted Pelvic:  Ext/BUS/vagina normal  Cervix normal. Paps/HPV  Uterus anteverted, normal size, shape and contour, midline and mobile nontender   Adnexa  Without masses or tenderness    Anus and perineum  Normal   Rectovaginal  Normal sphincter tone without palpated masses or tenderness.    Assessment/Plan:  44 y.o. G104P0020 female for annual exam with regular menses, no birth control.   1. Contraceptive management. Patient had more questions about Mirena IUD. She is considering proceeding with this. We have discussed contraceptive options multiple times. She continues to smoke although is trying to stop. I reviewed the Mirena IUD with her to include the insertional process and the risks/side effects. Possible hormone absorption with systemic effects reviewed. Failure with pregnancy discussed. Risks of uterine perforation or migration requiring surgery to remove as well as the risk of infection.  Literature was provided. Patient will follow up if she decides to pursue this. She again clearly understands the risk of pregnancy if she continues not to use contraception. Barrier and other progesterone only options to include  Depo-Provera, Nexplanon, progesterone only pills, nonhormonal IUDs have been discussed. 2. Mammography 04/2014. Continue with annual mammography. SBE monthly reviewed. 3. Pap smear 2012. Pap/HPV today. No history of significant abnormal Pap smears previously. 4. Health maintenance. Patient recently had appointment with Dr. Inda Merlin and reports having all of her routine blood work done through their office. Follow up with contraceptive decision otherwise annually     Anastasio Auerbach MD, 2:31 PM 06/23/2014

## 2014-06-23 NOTE — Addendum Note (Signed)
Addended by: Nelva Nay on: 06/23/2014 02:52 PM   Modules accepted: Orders

## 2014-06-23 NOTE — Patient Instructions (Signed)
Follow up for the Mirena IUD if you choose.  You may obtain a copy of any labs that were done today by logging onto MyChart as outlined in the instructions provided with your AVS (after visit summary). The office will not call with normal lab results but certainly if there are any significant abnormalities then we will contact you.   Health Maintenance, Female A healthy lifestyle and preventative care can promote health and wellness.  Maintain regular health, dental, and eye exams.  Eat a healthy diet. Foods like vegetables, fruits, whole grains, low-fat dairy products, and lean protein foods contain the nutrients you need without too many calories. Decrease your intake of foods high in solid fats, added sugars, and salt. Get information about a proper diet from your caregiver, if necessary.  Regular physical exercise is one of the most important things you can do for your health. Most adults should get at least 150 minutes of moderate-intensity exercise (any activity that increases your heart rate and causes you to sweat) each week. In addition, most adults need muscle-strengthening exercises on 2 or more days a week.   Maintain a healthy weight. The body mass index (BMI) is a screening tool to identify possible weight problems. It provides an estimate of body fat based on height and weight. Your caregiver can help determine your BMI, and can help you achieve or maintain a healthy weight. For adults 20 years and older:  A BMI below 18.5 is considered underweight.  A BMI of 18.5 to 24.9 is normal.  A BMI of 25 to 29.9 is considered overweight.  A BMI of 30 and above is considered obese.  Maintain normal blood lipids and cholesterol by exercising and minimizing your intake of saturated fat. Eat a balanced diet with plenty of fruits and vegetables. Blood tests for lipids and cholesterol should begin at age 44 and be repeated every 5 years. If your lipid or cholesterol levels are high, you are  over 50, or you are a high risk for heart disease, you may need your cholesterol levels checked more frequently.Ongoing high lipid and cholesterol levels should be treated with medicines if diet and exercise are not effective.  If you smoke, find out from your caregiver how to quit. If you do not use tobacco, do not start.  Lung cancer screening is recommended for adults aged 3 80 years who are at high risk for developing lung cancer because of a history of smoking. Yearly low-dose computed tomography (CT) is recommended for people who have at least a 30-pack-year history of smoking and are a current smoker or have quit within the past 15 years. A pack year of smoking is smoking an average of 1 pack of cigarettes a day for 1 year (for example: 1 pack a day for 30 years or 2 packs a day for 15 years). Yearly screening should continue until the smoker has stopped smoking for at least 15 years. Yearly screening should also be stopped for people who develop a health problem that would prevent them from having lung cancer treatment.  If you are pregnant, do not drink alcohol. If you are breastfeeding, be very cautious about drinking alcohol. If you are not pregnant and choose to drink alcohol, do not exceed 1 drink per day. One drink is considered to be 12 ounces (355 mL) of beer, 5 ounces (148 mL) of wine, or 1.5 ounces (44 mL) of liquor.  Avoid use of street drugs. Do not share needles with anyone. Ask  for help if you need support or instructions about stopping the use of drugs.  High blood pressure causes heart disease and increases the risk of stroke. Blood pressure should be checked at least every 1 to 2 years. Ongoing high blood pressure should be treated with medicines, if weight loss and exercise are not effective.  If you are 55 to 45 years old, ask your caregiver if you should take aspirin to prevent strokes.  Diabetes screening involves taking a blood sample to check your fasting blood sugar  level. This should be done once every 3 years, after age 45, if you are within normal weight and without risk factors for diabetes. Testing should be considered at a younger age or be carried out more frequently if you are overweight and have at least 1 risk factor for diabetes.  Breast cancer screening is essential preventative care for women. You should practice "breast self-awareness." This means understanding the normal appearance and feel of your breasts and may include breast self-examination. Any changes detected, no matter how small, should be reported to a caregiver. Women in their 20s and 30s should have a clinical breast exam (CBE) by a caregiver as part of a regular health exam every 1 to 3 years. After age 40, women should have a CBE every year. Starting at age 40, women should consider having a mammogram (breast X-ray) every year. Women who have a family history of breast cancer should talk to their caregiver about genetic screening. Women at a high risk of breast cancer should talk to their caregiver about having an MRI and a mammogram every year.  Breast cancer gene (BRCA)-related cancer risk assessment is recommended for women who have family members with BRCA-related cancers. BRCA-related cancers include breast, ovarian, tubal, and peritoneal cancers. Having family members with these cancers may be associated with an increased risk for harmful changes (mutations) in the breast cancer genes BRCA1 and BRCA2. Results of the assessment will determine the need for genetic counseling and BRCA1 and BRCA2 testing.  The Pap test is a screening test for cervical cancer. Women should have a Pap test starting at age 21. Between ages 21 and 29, Pap tests should be repeated every 2 years. Beginning at age 30, you should have a Pap test every 3 years as long as the past 3 Pap tests have been normal. If you had a hysterectomy for a problem that was not cancer or a condition that could lead to cancer, then  you no longer need Pap tests. If you are between ages 65 and 70, and you have had normal Pap tests going back 10 years, you no longer need Pap tests. If you have had past treatment for cervical cancer or a condition that could lead to cancer, you need Pap tests and screening for cancer for at least 20 years after your treatment. If Pap tests have been discontinued, risk factors (such as a new sexual partner) need to be reassessed to determine if screening should be resumed. Some women have medical problems that increase the chance of getting cervical cancer. In these cases, your caregiver may recommend more frequent screening and Pap tests.  The human papillomavirus (HPV) test is an additional test that may be used for cervical cancer screening. The HPV test looks for the virus that can cause the cell changes on the cervix. The cells collected during the Pap test can be tested for HPV. The HPV test could be used to screen women aged 30 years and   older, and should be used in women of any age who have unclear Pap test results. After the age of 46, women should have HPV testing at the same frequency as a Pap test.  Colorectal cancer can be detected and often prevented. Most routine colorectal cancer screening begins at the age of 34 and continues through age 35. However, your caregiver may recommend screening at an earlier age if you have risk factors for colon cancer. On a yearly basis, your caregiver may provide home test kits to check for hidden blood in the stool. Use of a small camera at the end of a tube, to directly examine the colon (sigmoidoscopy or colonoscopy), can detect the earliest forms of colorectal cancer. Talk to your caregiver about this at age 68, when routine screening begins. Direct examination of the colon should be repeated every 5 to 10 years through age 15, unless early forms of pre-cancerous polyps or small growths are found.  Hepatitis C blood testing is recommended for all people born  from [birth year] through 1966 and any individual with known risks for hepatitis C.  Practice safe sex. Use condoms and avoid high-risk sexual practices to reduce the spread of sexually transmitted infections (STIs). Sexually active women aged 102 and younger should be checked for Chlamydia, which is a common sexually transmitted infection. Older women with new or multiple partners should also be tested for Chlamydia. Testing for other STIs is recommended if you are sexually active and at increased risk.  Osteoporosis is a disease in which the bones lose minerals and strength with aging. This can result in serious bone fractures. The risk of osteoporosis can be identified using a bone density scan. Women ages 82 and over and women at risk for fractures or osteoporosis should discuss screening with their caregivers. Ask your caregiver whether you should be taking a calcium supplement or vitamin D to reduce the rate of osteoporosis.  Menopause can be associated with physical symptoms and risks. Hormone replacement therapy is available to decrease symptoms and risks. You should talk to your caregiver about whether hormone replacement therapy is right for you.  Use sunscreen. Apply sunscreen liberally and repeatedly throughout the day. You should seek shade when your shadow is shorter than you. Protect yourself by wearing long sleeves, pants, a wide-brimmed hat, and sunglasses year round, whenever you are outdoors.  Notify your caregiver of new moles or changes in moles, especially if there is a change in shape or color. Also notify your caregiver if a mole is larger than the size of a pencil eraser.  Stay current with your immunizations. Document Released: 11/27/2010 Document Revised: 09/08/2012 Document Reviewed: 11/27/2010 [facility] Patient Information 2014 [place].

## 2014-06-24 LAB — URINALYSIS W MICROSCOPIC + REFLEX CULTURE
Bacteria, UA: NONE SEEN
Bilirubin Urine: NEGATIVE
CASTS: NONE SEEN
Crystals: NONE SEEN
Glucose, UA: NEGATIVE mg/dL
HGB URINE DIPSTICK: NEGATIVE
KETONES UR: NEGATIVE mg/dL
LEUKOCYTES UA: NEGATIVE
NITRITE: NEGATIVE
PROTEIN: NEGATIVE mg/dL
SPECIFIC GRAVITY, URINE: 1.019 (ref 1.005–1.030)
Squamous Epithelial / LPF: NONE SEEN
Urobilinogen, UA: 1 mg/dL (ref 0.0–1.0)
pH: 7 (ref 5.0–8.0)

## 2014-06-28 LAB — CYTOLOGY - PAP

## 2015-07-14 ENCOUNTER — Encounter: Payer: Self-pay | Admitting: Gynecology

## 2015-09-19 ENCOUNTER — Other Ambulatory Visit: Payer: Self-pay | Admitting: Internal Medicine

## 2015-09-19 ENCOUNTER — Ambulatory Visit
Admission: RE | Admit: 2015-09-19 | Discharge: 2015-09-19 | Disposition: A | Discharge status: Home / Self Care | Payer: BC Managed Care – PPO | Source: Ambulatory Visit | Attending: Internal Medicine | Admitting: Internal Medicine

## 2015-09-19 DIAGNOSIS — R0781 Pleurodynia: Secondary | ICD-10-CM

## 2015-09-20 ENCOUNTER — Other Ambulatory Visit: Payer: Self-pay | Admitting: Internal Medicine

## 2015-09-20 DIAGNOSIS — R1031 Right lower quadrant pain: Secondary | ICD-10-CM

## 2015-09-21 ENCOUNTER — Other Ambulatory Visit: Payer: Self-pay | Admitting: Internal Medicine

## 2015-09-21 DIAGNOSIS — R1031 Right lower quadrant pain: Secondary | ICD-10-CM

## 2015-09-26 ENCOUNTER — Ambulatory Visit
Admission: RE | Admit: 2015-09-26 | Discharge: 2015-09-26 | Disposition: A | Discharge status: Home / Self Care | Payer: BC Managed Care – PPO | Source: Ambulatory Visit | Attending: Internal Medicine | Admitting: Internal Medicine

## 2015-09-26 DIAGNOSIS — R1031 Right lower quadrant pain: Secondary | ICD-10-CM

## 2015-10-31 ENCOUNTER — Encounter: Payer: Self-pay | Admitting: Gynecology

## 2015-10-31 ENCOUNTER — Ambulatory Visit (INDEPENDENT_AMBULATORY_CARE_PROVIDER_SITE_OTHER): Payer: BC Managed Care – PPO | Admitting: Gynecology

## 2015-10-31 VITALS — BP 118/74 | Ht 67.5 in | Wt 185.0 lb

## 2015-10-31 DIAGNOSIS — N83209 Unspecified ovarian cyst, unspecified side: Secondary | ICD-10-CM

## 2015-10-31 DIAGNOSIS — Z01419 Encounter for gynecological examination (general) (routine) without abnormal findings: Secondary | ICD-10-CM

## 2015-10-31 DIAGNOSIS — R102 Pelvic and perineal pain: Secondary | ICD-10-CM

## 2015-10-31 NOTE — Patient Instructions (Signed)
Follow up for ultrasound as scheduled 

## 2015-10-31 NOTE — Progress Notes (Signed)
    Connie Fox Jul 30, 1970 [id number]        45 y.o.  G2P0020  for annual exam.  Several issues noted below.  Past medical history,surgical history, problem list, medications, allergies, family history and social history were all reviewed and documented as reviewed in the EPIC chart.  ROS:  Performed with pertinent positives and negatives included in the history, assessment and plan.   Additional significant findings :  none   Exam: [name] assistant Filed Vitals:   10/31/15 1426  BP: 118/74  Height: 5' 7.5" (1.715 m)  Weight: 185 lb (83.915 kg)   General appearance:  Normal affect, orientation and appearance. Skin: Grossly normal HEENT: Without gross lesions.  No cervical or supraclavicular adenopathy. Thyroid normal.  Lungs:  Clear without wheezing, rales or rhonchi Cardiac: RR, without RMG Abdominal:  Soft, nontender, without masses, guarding, rebound, organomegaly or hernia Breasts:  Examined lying and sitting without masses, retractions, discharge or axillary adenopathy.  Bilateral implants noted Pelvic:  Ext/BUS/vagina normal  Cervix normal  Uterus anteverted, normal size, shape and contour, midline and mobile nontender   Adnexa without masses or tenderness    Anus and perineum normal   Rectovaginal normal sphincter tone without palpated masses or tenderness.    Assessment/Plan:  45 y.o. G32P0020 female for annual exam with regular menses, no contraception.   1. Contraception. Patient not currently sexually active. We discussed options in the past (06/23/2014 note) and she still is leaning towards Mirena IUD. She plans to schedule an appointment for placement with her menses. 2. Left lower quadrant pain. Ultrasound ordered by her primary physician showed right hemorrhagic small ovarian cyst and multiple small myomas. She notes the pain is getting better over the past month. 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 Patient will schedule follow up for this. 3. Pap smear/HPV 05/2014 negative. No Pap smear done today. No history of significant abnormal Pap smears previously. 4. Mammography 06/2015. Continue with annual mammography when due. SBE monthly reviewed. 5. Health maintenance. No routine lab work done as patient reports this done through her primary physician's office. Follow up for ultrasound, follow up for Mirena IUD as she chooses otherwise follow up for annual exam in one year   Anastasio Auerbach MD, 2:51 PM 10/31/2015

## 2015-11-01 ENCOUNTER — Telehealth: Payer: Self-pay | Admitting: Gynecology

## 2015-11-01 NOTE — Telephone Encounter (Signed)
11/01/15-I LM VM for pt that her BC ins will cover the Mirena for contraception at 100%, no copay. Per Cherry@BC [id number]

## 2015-11-03 ENCOUNTER — Ambulatory Visit (INDEPENDENT_AMBULATORY_CARE_PROVIDER_SITE_OTHER): Payer: BC Managed Care – PPO | Admitting: Gynecology

## 2015-11-03 ENCOUNTER — Encounter: Payer: Self-pay | Admitting: Gynecology

## 2015-11-03 ENCOUNTER — Other Ambulatory Visit: Payer: Self-pay | Admitting: Gynecology

## 2015-11-03 ENCOUNTER — Ambulatory Visit (INDEPENDENT_AMBULATORY_CARE_PROVIDER_SITE_OTHER): Payer: BC Managed Care – PPO

## 2015-11-03 VITALS — BP 120/76

## 2015-11-03 DIAGNOSIS — R102 Pelvic and perineal pain: Secondary | ICD-10-CM

## 2015-11-03 DIAGNOSIS — N8312 Corpus luteum cyst of left ovary: Secondary | ICD-10-CM | POA: Diagnosis not present

## 2015-11-03 DIAGNOSIS — N83202 Unspecified ovarian cyst, left side: Secondary | ICD-10-CM | POA: Diagnosis not present

## 2015-11-03 DIAGNOSIS — D251 Intramural leiomyoma of uterus: Secondary | ICD-10-CM

## 2015-11-03 DIAGNOSIS — N912 Amenorrhea, unspecified: Secondary | ICD-10-CM | POA: Diagnosis not present

## 2015-11-03 DIAGNOSIS — N83209 Unspecified ovarian cyst, unspecified side: Secondary | ICD-10-CM

## 2015-11-03 MED ORDER — MEDROXYPROGESTERONE ACETATE 10 MG PO TABS: 10.0000 mg | ORAL_TABLET | Freq: Every day | ORAL | Status: DC

## 2015-11-03 NOTE — Patient Instructions (Signed)
Follow up for Mirena IUD at your convenience.

## 2015-11-03 NOTE — Progress Notes (Addendum)
    Connie Fox 07/26/1970 [id number]        45 y.o.  G2P0020 presents for ultrasound.  History of lower left abdominal discomfort. Ultrasound by her primary physician showed a right hemorrhagic appearing small cyst and multiple small myomas. I asked her to follow up just to make sure this has resolved.  She also notes that her last menstrual period was in April. She was having regular menses until then and no bleeding since. No hot flushes night sweats hair skin or weight changes. Not sexually active and adamantly denies possible pregnancy.  Recent exam 3 days ago was normal.  Past medical history,surgical history, problem list, medications, allergies, family history and social history were all reviewed and documented in the EPIC chart.  Directed ROS with pertinent positives and negatives documented in the history of present illness/assessment and plan.  Exam: Filed Vitals:   11/03/15 1431  BP: 120/76   General appearance:  Normal  Ultrasound shows uterus generous size with multiple small myomas measuring 26 mm, 17 mm, 15 mm, 13 mm, 12 mm, 12 mm, 11 mm . Endometrial echo 10.8 mm. Right ovary normal. Left ovary with corpus luteal cyst 17 x 14 x 11 mm with peripheral flow. Cul-de-sac negative   Assessment/Plan:  45 y.o. G2P0020  with:  1. History of left-sided pain now resolved ultrasound shows multiple small myomas and left corpus luteal cyst. Prior ultrasound showed right cyst which has resolved. Plan expectant management. Discussed leiomyoma with her and she is asymptomatic we will follow for now. 2. Contraception. Patient has decided she wants to pursue Mirena IUD and will do so at her convenience again she is not sexually active at this point. 3. LMP April with regular menses before. Discussed with her probably ovulatory dysfunction. Will withdraw with Provera 10 mg 10 days. If resumes regular menses them will follow. If skips or irregularity continues she'll represent for further  evaluation to include hormonal level blood work.  Greater than 50% of my time was spent in direct face to face counseling and coordination of care with the patient.   Anastasio Auerbach MD, 2:44 PM 11/03/2015

## 2015-12-26 ENCOUNTER — Telehealth: Payer: Self-pay | Admitting: Gynecology

## 2015-12-26 NOTE — Telephone Encounter (Signed)
12/26/15-I called to remind the pt that I had already checked her benefits for the Mirena back in June this year and that it was covered at 100%, no copay, for contracepton.wl

## 2015-12-28 ENCOUNTER — Encounter: Payer: Self-pay | Admitting: Gynecology

## 2015-12-28 ENCOUNTER — Ambulatory Visit (INDEPENDENT_AMBULATORY_CARE_PROVIDER_SITE_OTHER): Payer: BC Managed Care – PPO | Admitting: Gynecology

## 2015-12-28 VITALS — BP 120/76

## 2015-12-28 DIAGNOSIS — Z30430 Encounter for insertion of intrauterine contraceptive device: Secondary | ICD-10-CM

## 2015-12-28 HISTORY — PX: INTRAUTERINE DEVICE INSERTION: SHX323

## 2015-12-28 NOTE — Progress Notes (Signed)
    Cj Platek Nov 16, 1970 [id number]        45 y.o.  G2P0020  presents for Mirena IUD placement. She has read through the booklet, has no contraindications and signed the consent form. She currently is on a normal menses.  I reviewed the insertional process with her as well as the risks to include infection, either immediate or long-term, uterine perforation or migration requiring surgery to remove, other complications such as pain, hormonal side effects and possibility of failure with subsequent pregnancy.   Exam with [name] assistant Vitals:   12/28/15 0902  BP: 120/76    Pelvic: External BUS vagina normal. Cervix normal With light menses flow. Uterus anteverted normal size shape contour midline mobile nontender. Adnexa without masses or tenderness.  Procedure: The cervix was cleansed with Betadine, anterior lip grasped with a single-tooth tenaculum, the uterus was sounded. The cervix was slightly stenotic requiring gentle dilatation. A Mirena IUD was subsequently placed according to manufacturer's recommendations without difficulty. The strings were trimmed. The patient tolerated well and will follow up in one month for a postinsertional check.  Lot number:  [id number]    Anastasio Auerbach MD, 9:30 AM 12/28/2015

## 2015-12-28 NOTE — Patient Instructions (Signed)
Intrauterine Device Insertion Most often, an intrauterine device (IUD) is inserted into the uterus to prevent pregnancy. There are 2 types of IUDs available:  Copper IUD--This type of IUD creates an environment that is not favorable to sperm survival. The mechanism of action of the copper IUD is not known for certain. It can stay in place for 10 years.  Hormone IUD--This type of IUD contains the hormone progestin (synthetic progesterone). The progestin thickens the cervical mucus and prevents sperm from entering the uterus, and it also thins the uterine lining. There is no evidence that the hormone IUD prevents implantation. One hormone IUD can stay in place for up to 5 years, and a different hormone IUD can stay in place for up to 3 years. An IUD is the most cost-effective birth control if left in place for the full duration. It may be removed at any time. LET YOUR HEALTH CARE PROVIDER KNOW ABOUT:  Any allergies you have.  All medicines you are taking, including vitamins, herbs, eye drops, creams, and over-the-counter medicines.  Previous problems you or members of your family have had with the use of anesthetics.  Any blood disorders you have.  Previous surgeries you have had.  Possibility of pregnancy.  Medical conditions you have. RISKS AND COMPLICATIONS  Generally, intrauterine device insertion is a safe procedure. However, as with any procedure, complications can occur. Possible complications include:  Accidental puncture (perforation) of the uterus.  Accidental placement of the IUD either in the muscle layer of the uterus (myometrium) or outside the uterus. If this happens, the IUD can be found essentially floating around the bowels and must be taken out surgically.  The IUD may fall out of the uterus (expulsion). This is more common in women who have recently had a child.   Pregnancy in the fallopian tube (ectopic).  Pelvic inflammatory disease (PID), which is infection of  the uterus and fallopian tubes. The risk of PID is slightly increased in the first 20 days after the IUD is placed, but the overall risk is still very low. BEFORE THE PROCEDURE  Schedule the IUD insertion for when you will have your menstrual period or right after, to make sure you are not pregnant. Placement of the IUD is better tolerated shortly after a menstrual cycle.  You may need to take tests or be examined to make sure you are not pregnant.  You may be required to take a pregnancy test.  You may be required to get checked for sexually transmitted infections (STIs) prior to placement. Placing an IUD in someone who has an infection can make the infection worse.  You may be given a pain reliever to take 1 or 2 hours before the procedure.  An exam will be performed to determine the size and position of your uterus.  Ask your health care provider about changing or stopping your regular medicines. PROCEDURE   A tool (speculum) is placed in the vagina. This allows your health care provider to see the lower part of the uterus (cervix).  The cervix is prepped with a medicine that lowers the risk of infection.  You may be given a medicine to numb each side of the cervix (intracervical or paracervical block). This is used to block and control any discomfort with insertion.  A tool (uterine sound) is inserted into the uterus to determine the length of the uterine cavity and the direction the uterus may be tilted.  A slim instrument (IUD inserter) is inserted through the cervical   canal and into your uterus.  The IUD is placed in the uterine cavity and the insertion device is removed.  The nylon string that is attached to the IUD and used for eventual IUD removal is trimmed. It is trimmed so that it lays high in the vagina, just outside the cervix. AFTER THE PROCEDURE  You may have bleeding after the procedure. This is normal. It varies from light spotting for a few days to menstrual-like  bleeding.  You may have mild cramping.   This information is not intended to replace advice given to you by your health care provider. Make sure you discuss any questions you have with your health care provider.   Document Released: 01/10/2011 Document Revised: 03/04/2013 Document Reviewed: 11/02/2012 Elsevier Interactive Patient Education 2016 [employer]  

## 2016-02-02 ENCOUNTER — Ambulatory Visit (INDEPENDENT_AMBULATORY_CARE_PROVIDER_SITE_OTHER): Payer: BC Managed Care – PPO | Admitting: Gynecology

## 2016-02-02 VITALS — BP 120/78

## 2016-02-02 DIAGNOSIS — Z30431 Encounter for routine checking of intrauterine contraceptive device: Secondary | ICD-10-CM

## 2016-02-02 DIAGNOSIS — Z23 Encounter for immunization: Secondary | ICD-10-CM

## 2016-02-02 NOTE — Progress Notes (Signed)
    Connie Fox 09/17/1970 [id number]        45 y.o.  G2P0020 presents for follow up IUD check. Is doing well without complaints.  Past medical history,surgical history, problem list, medications, allergies, family history and social history were all reviewed and documented in the EPIC chart.  Directed ROS with pertinent positives and negatives documented in the history of present illness/assessment and plan.  Exam: [name] assistant Vitals:   02/02/16 1024  BP: 120/78   General appearance:  Normal Abdomen soft nontender without masses guarding rebound Pelvic external BUS vagina normal. Cervix normal with IUD string visualized in appropriate length. Uterus normal size midline mobile nontender. Adnexa without masses or tenderness.  Assessment/Plan:  45 y.o. G2P0020 with normal IUD check. Follow up in June 2018 when due for annual exam, sooner if any issues.    Anastasio Auerbach MD, 10:45 AM 02/02/2016

## 2016-02-02 NOTE — Addendum Note (Signed)
Addended by: Nelva Nay on: 02/02/2016 10:50 AM   Modules accepted: Orders

## 2016-02-02 NOTE — Patient Instructions (Signed)
Follow up for your annual exam in June 2018 when due. Sooner if any issues.

## 2016-09-19 ENCOUNTER — Encounter: Payer: Self-pay | Admitting: Gynecology

## 2016-09-20 ENCOUNTER — Encounter: Payer: Self-pay | Admitting: Gynecology

## 2016-09-28 ENCOUNTER — Other Ambulatory Visit: Payer: Self-pay

## 2016-10-14 IMAGING — US US TRANSVAGINAL NON-OB
1 series · 13 of 25 positions shown · non-contrast
Comparison: 01/18/2009.

CLINICAL DATA: Right lower quadrant pain .

EXAM:
TRANSABDOMINAL AND TRANSVAGINAL ULTRASOUND OF PELVIS
TECHNIQUE: Both transabdominal and transvaginal ultrasound examinations of the
pelvis were performed. Transabdominal technique was performed for
global imaging of the pelvis including uterus, ovaries, adnexal
regions, and pelvic cul-de-sac. It was necessary to proceed with
endovaginal exam following the transabdominal exam to visualize the
uterus and ovaries.

[Series 1: us transvaginal non-ob · 0.31mm/px · 13 of 78 slices shown]
[im 1/78]
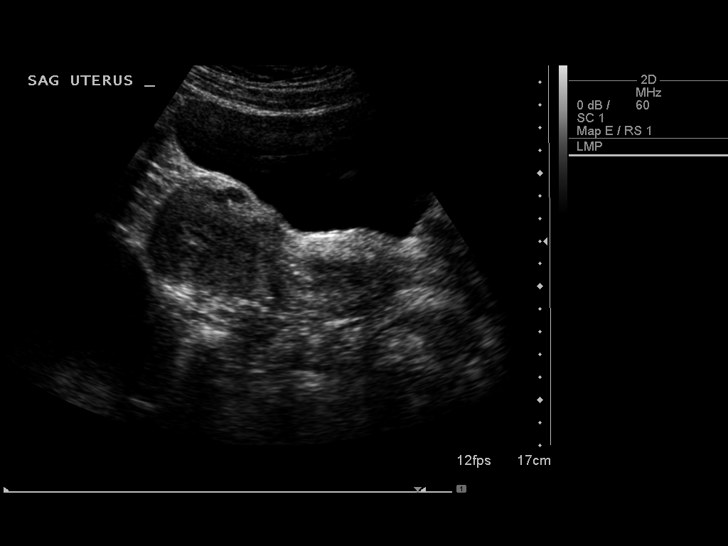
[im 7/78]
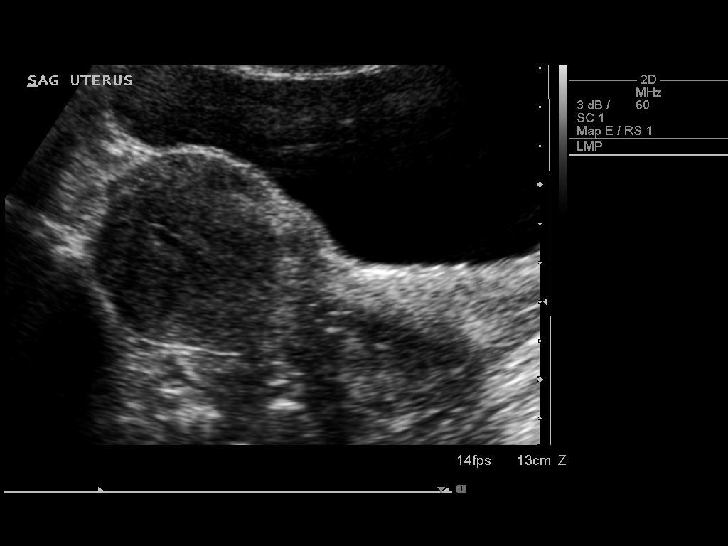
[im 13/78]
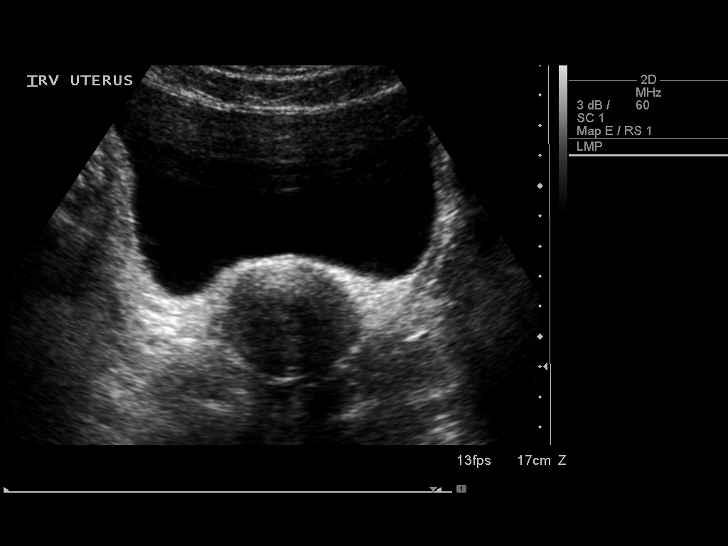
[im 20/78]
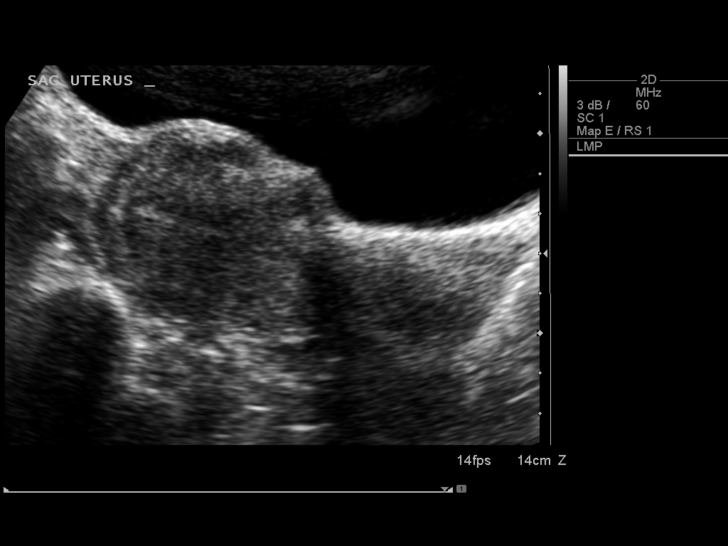
[im 26/78]
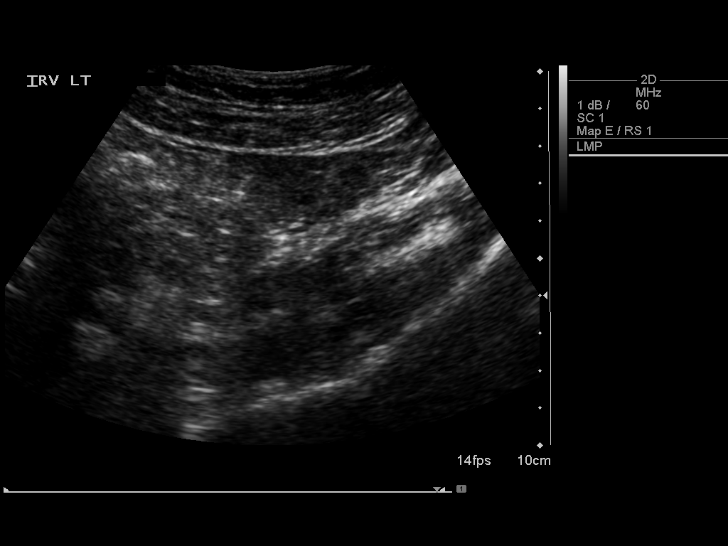
[im 33/78]
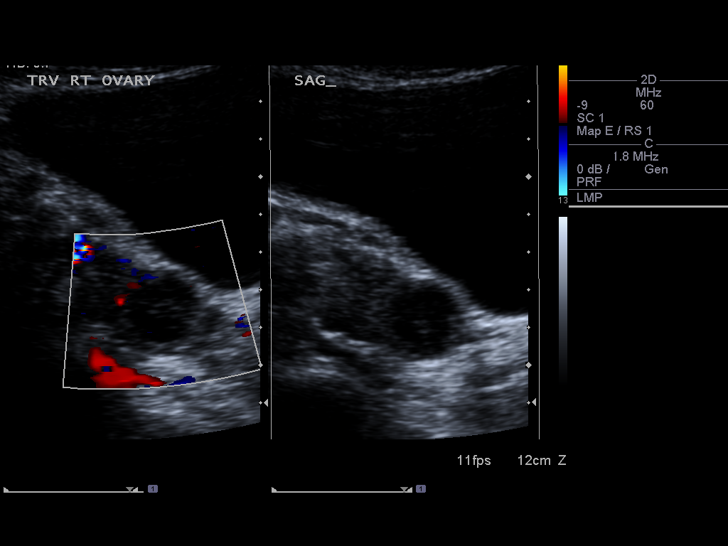
[im 39/78]
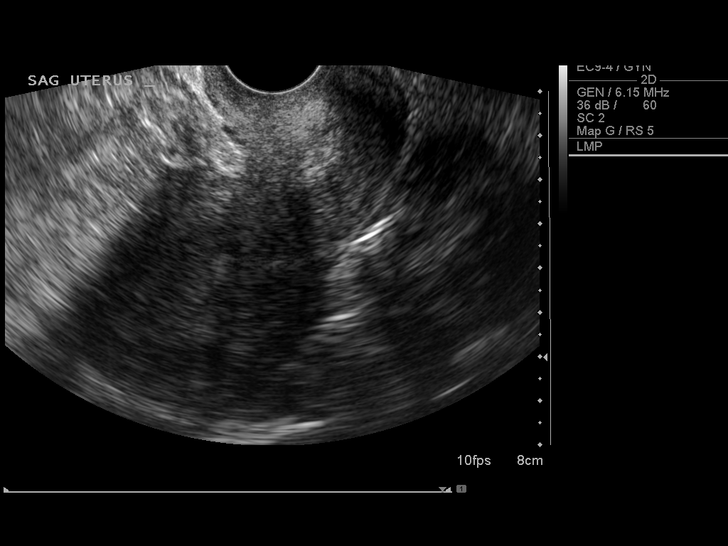
[im 45/78]
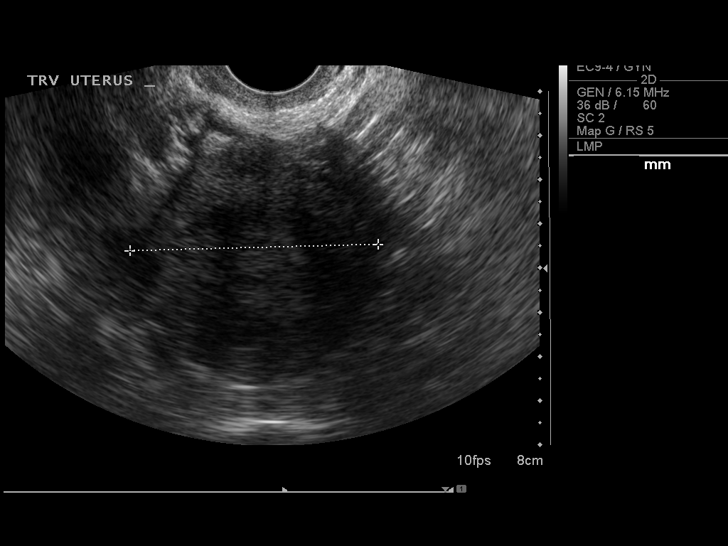
[im 52/78]
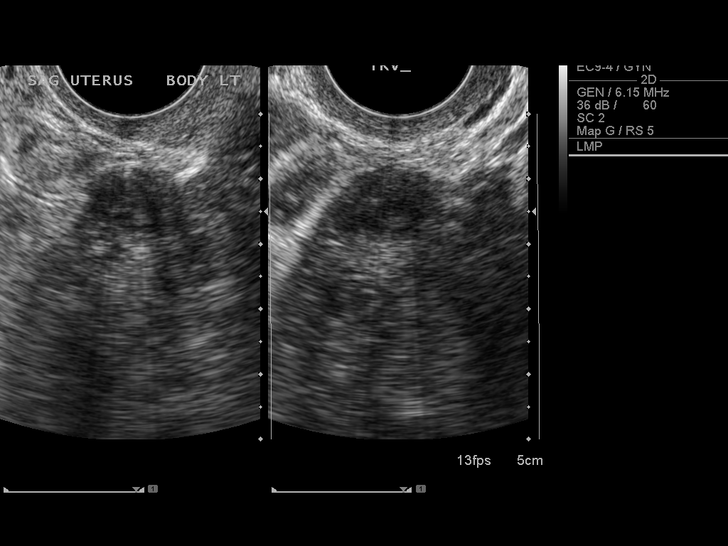
[im 58/78]
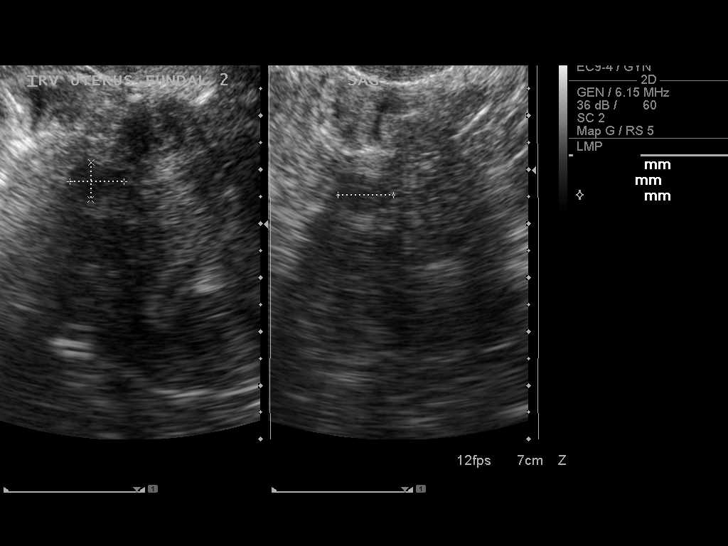
[im 65/78]
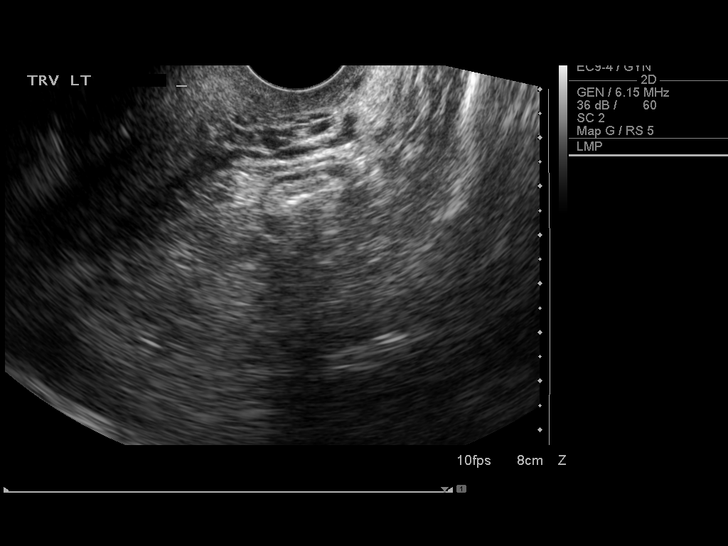
[im 71/78]
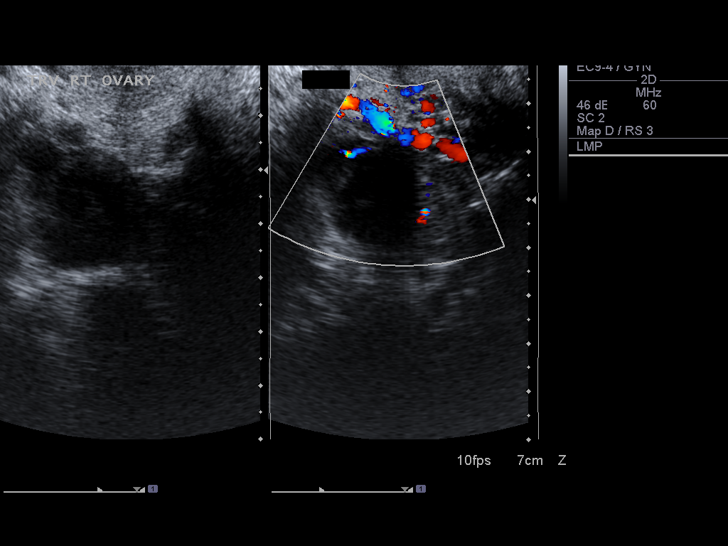
[im 78/78]
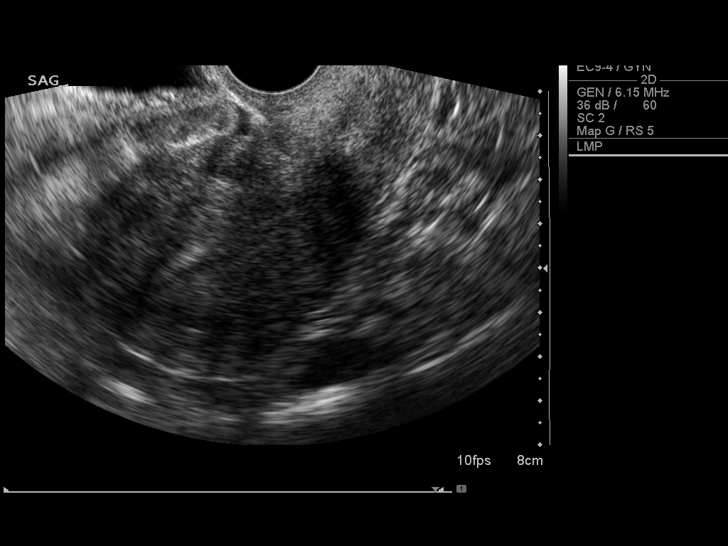

[13 of 25 positions shown; findings below may reference images not displayed]

FINDINGS: Uterus

Measurements: 11.3 x 5.0 x 5.2 cm. Multiple fibroids, the largest
measures 1.5 cm in the left body. A 1.1 cm submucosal fibroid noted
along the anterior body.

Endometrium

Thickness: 10.3 mm.  No focal abnormality visualized.

Right ovary

Measurements: 3.1 x 2.6 x 3.1 cm. 1.7 x 2.1 x 1.9 cm complex cyst
possibly hemorrhagic cyst.

Left ovary

Measurements: 1.9 x 2.8 x 1.2 cm. Normal appearance/no adnexal mass.

Other findings

No abnormal free fluid.
IMPRESSION: 1. Fibroid uterus.

2. 2.1 cm complex cyst right ovary. This could represent small
hemorrhagic cyst. Short-interval follow up ultrasound in 6-12 weeks
is recommended, preferably during the week following the patient's
normal menses.

## 2016-10-31 ENCOUNTER — Encounter: Payer: Self-pay | Admitting: Gynecology

## 2016-10-31 ENCOUNTER — Ambulatory Visit (INDEPENDENT_AMBULATORY_CARE_PROVIDER_SITE_OTHER): Payer: BC Managed Care – PPO | Admitting: Gynecology

## 2016-10-31 VITALS — BP 122/78 | Ht 68.0 in | Wt 174.0 lb

## 2016-10-31 DIAGNOSIS — N926 Irregular menstruation, unspecified: Secondary | ICD-10-CM | POA: Diagnosis not present

## 2016-10-31 DIAGNOSIS — Z30431 Encounter for routine checking of intrauterine contraceptive device: Secondary | ICD-10-CM | POA: Diagnosis not present

## 2016-10-31 DIAGNOSIS — Z01419 Encounter for gynecological examination (general) (routine) without abnormal findings: Secondary | ICD-10-CM | POA: Diagnosis not present

## 2016-10-31 MED ORDER — MEGESTROL ACETATE 20 MG PO TABS: 20.0000 mg | ORAL_TABLET | Freq: Every day | ORAL | 0 refills | Status: DC

## 2016-10-31 NOTE — Progress Notes (Signed)
    Connie Fox [birth year] [id number]        46 y.o.  G2P0020 for annual exam.  Continues to have almost daily spotting with her IUD since insertion in August 2017. Also notes a little bit of cramping.  Past medical history,surgical history, problem list, medications, allergies, family history and social history were all reviewed and documented as reviewed in the EPIC chart.  ROS:  Performed with pertinent positives and negatives included in the history, assessment and plan.   Additional significant findings :  None   Exam: [name] assistant Vitals:   10/31/16 1457  BP: 122/78  Weight: 174 lb (78.9 kg)  Height: 5\' 8"  (1.727 m)   Body mass index is 26.46 kg/m.  General appearance:  Normal affect, orientation and appearance. Skin: Grossly normal HEENT: Without gross lesions.  No cervical or supraclavicular adenopathy. Thyroid normal.  Lungs:  Clear without wheezing, rales or rhonchi Cardiac: RR, without RMG Abdominal:  Soft, nontender, without masses, guarding, rebound, organomegaly or hernia Breasts:  Examined lying and sitting without masses, retractions, discharge or axillary adenopathy.  Bilateral implants noted. Pelvic:  Ext, BUS, Vagina: Normal  Cervix: Normal. IUD string visualized  Uterus: Anteverted, normal size, shape and contour, midline and mobile nontender   Adnexa: Without masses or tenderness    Anus and perineum: Normal   Rectovaginal: Normal sphincter tone without palpated masses or tenderness.    Assessment/Plan:  46 y.o. G35P0020 female for annual exam without menses, Mirena IUD.   1. Mirena IUD 12/2015. Having spotting almost daily. IUD string visualized in appropriate length. We'll treat with Megace 20 mg daily 14 days. Hopefully will eradicate her bleeding. If continues she'll call and we will start with ultrasound for placement verification. Options if continues to include removal and replacement of the IUD discussed. 2. Mammography 08/2016. Breast  exam normal today. Continue with annual mammography when due. SBE monthly reviewed. 3. Pap smear/HPV 05/2014. No Pap smear done today. No history of significant abnormal Pap smears. Plan repeat Pap smear at 5 year interval per current screening guidelines. 4. Health maintenance. No routine lab work done as patient does this elsewhere. Follow up 1 year, sooner as needed.   Anastasio Auerbach MD, 3:22 PM 10/31/2016

## 2016-10-31 NOTE — Patient Instructions (Signed)
Take the megace medication daily for 2 weeks.  Follow up if irregular bleeding continues

## 2017-04-11 ENCOUNTER — Other Ambulatory Visit: Payer: Self-pay | Admitting: Radiology

## 2017-04-12 ENCOUNTER — Encounter: Payer: Self-pay | Admitting: Gynecology

## 2017-10-10 ENCOUNTER — Encounter: Payer: Self-pay | Admitting: Gynecology

## 2017-11-01 ENCOUNTER — Encounter: Payer: BC Managed Care – PPO | Admitting: Gynecology

## 2017-11-13 ENCOUNTER — Ambulatory Visit: Payer: BC Managed Care – PPO | Admitting: Gynecology

## 2017-11-13 ENCOUNTER — Encounter: Payer: Self-pay | Admitting: Gynecology

## 2017-11-13 VITALS — BP 120/76 | Ht 68.0 in | Wt 164.0 lb

## 2017-11-13 DIAGNOSIS — Z01419 Encounter for gynecological examination (general) (routine) without abnormal findings: Secondary | ICD-10-CM | POA: Diagnosis not present

## 2017-11-13 DIAGNOSIS — Z30431 Encounter for routine checking of intrauterine contraceptive device: Secondary | ICD-10-CM

## 2017-11-13 NOTE — Patient Instructions (Signed)
Follow-up in 1 year for annual exam, sooner if any issues. 

## 2017-11-13 NOTE — Progress Notes (Signed)
    Sheridan Hew 04/11/1971 [id number]        47 y.o.  G2P0020 for annual gynecologic exam.  Doing well without gynecologic complaints.  Past medical history,surgical history, problem list, medications, allergies, family history and social history were all reviewed and documented as reviewed in the EPIC chart.  ROS:  Performed with pertinent positives and negatives included in the history, assessment and plan.   Additional significant findings : None   Exam: [name] assistant Vitals:   11/13/17 1631  BP: 120/76  Weight: 164 lb (74.4 kg)  Height: 5\' 8"  (1.727 m)   Body mass index is 24.94 kg/m.  General appearance:  Normal affect, orientation and appearance. Skin: Grossly normal HEENT: Without gross lesions.  No cervical or supraclavicular adenopathy. Thyroid normal.  Lungs:  Clear without wheezing, rales or rhonchi Cardiac: RR, without RMG Abdominal:  Soft, nontender, without masses, guarding, rebound, organomegaly or hernia Breasts:  Examined lying and sitting without masses, retractions, discharge or axillary adenopathy.  Bilateral implants noted. Pelvic:  Ext, BUS, Vagina: Normal  Cervix: Normal with IUD string visualized  Uterus: Anteverted, normal size, shape and contour, midline and mobile nontender   Adnexa: Without masses or tenderness    Anus and perineum: Normal   Rectovaginal: Normal sphincter tone without palpated masses or tenderness.    Assessment/Plan:  48 y.o. G55P0020 female for annual gynecologic exam without menses, Mirena IUD.   1. Mirena IUD 12/2015.  Doing well.  Having monthly menses which sometimes very when they occur but acceptable to her.  IUD string visualized. 2. Pap smear/HPV 05/2014.  No Pap smear done today.  No history of significant abnormal Pap smears.  Plan repeat Pap smear/HPV at 5-year interval per current screening guidelines. 3. Mammography 03/2017.  Continue with annual mammography this coming fall.  Breast exam normal  today. 4. Health maintenance.  No routine lab work done as patient does this elsewhere.  Follow-up 1 year, sooner as needed.   Anastasio Auerbach MD, 4:57 PM 11/13/2017

## 2018-11-14 ENCOUNTER — Encounter: Payer: Self-pay | Admitting: Gynecology

## 2018-11-18 ENCOUNTER — Other Ambulatory Visit: Payer: Self-pay

## 2018-11-19 ENCOUNTER — Encounter: Payer: Self-pay | Admitting: Gynecology

## 2018-11-19 ENCOUNTER — Ambulatory Visit: Payer: BC Managed Care – PPO | Admitting: Gynecology

## 2018-11-19 VITALS — BP 118/76 | Ht 67.0 in | Wt 160.0 lb

## 2018-11-19 DIAGNOSIS — Z01419 Encounter for gynecological examination (general) (routine) without abnormal findings: Secondary | ICD-10-CM

## 2018-11-19 DIAGNOSIS — Z1151 Encounter for screening for human papillomavirus (HPV): Secondary | ICD-10-CM

## 2018-11-19 DIAGNOSIS — Z30431 Encounter for routine checking of intrauterine contraceptive device: Secondary | ICD-10-CM | POA: Diagnosis not present

## 2018-11-19 NOTE — Addendum Note (Signed)
Addended by: Nelva Nay on: 11/19/2018 04:37 PM   Modules accepted: Orders

## 2018-11-19 NOTE — Progress Notes (Signed)
    Connie Fox [birth year] [id number]        48 y.o.  G2P0020 for annual gynecologic exam.  Without gynecologic complaints  Past medical history,surgical history, problem list, medications, allergies, family history and social history were all reviewed and documented as reviewed in the EPIC chart.  ROS:  Performed with pertinent positives and negatives included in the history, assessment and plan.   Additional significant findings : None   Exam: [name] assistant Vitals:   11/19/18 1559  BP: 118/76  Weight: 160 lb (72.6 kg)  Height: 5\' 7"  (1.702 m)   Body mass index is 25.06 kg/m.  General appearance:  Normal affect, orientation and appearance. Skin: Grossly normal HEENT: Without gross lesions.  No cervical or supraclavicular adenopathy. Thyroid normal.  Lungs:  Clear without wheezing, rales or rhonchi Cardiac: RR, without RMG Abdominal:  Soft, nontender, without masses, guarding, rebound, organomegaly or hernia Breasts:  Examined lying and sitting without masses, retractions, discharge or axillary adenopathy.  Bilateral implants noted Pelvic:  Ext, BUS, Vagina: Normal  Cervix: Normal.  IUD string visualized.  Pap smear/HPV  Uterus: Anteverted, normal size, shape and contour, midline and mobile nontender   Adnexa: Without masses or tenderness    Anus and perineum: Normal   Rectovaginal: Normal sphincter tone without palpated masses or tenderness.    Assessment/Plan:  48 y.o. G61P0020 female for annual gynecologic exam.  Without menses, Mirena IUD  1. Mirena IUD 12/2015.  Doing well without menses.  IUD string visualized. 2. Mammography 10/2018.  Continue with annual mammography next year.  Breast exam normal today. 3. Pap smear/HPV 05/2014.  Pap smear/HPV today.  No history of abnormal Pap smears. 4. Health maintenance.  Patient reports routine lab work done elsewhere.  Follow-up 1 year, sooner as needed.   Anastasio Auerbach MD, 4:21 PM 11/19/2018

## 2018-11-19 NOTE — Patient Instructions (Signed)
Follow-up in 1 year for annual exam 

## 2018-11-20 LAB — PAP IG AND HPV HIGH-RISK: HPV DNA High Risk: NOT DETECTED

## 2019-02-18 ENCOUNTER — Encounter: Payer: Self-pay | Admitting: Gynecology

## 2019-11-16 ENCOUNTER — Encounter: Payer: Self-pay | Admitting: Obstetrics and Gynecology

## 2019-11-20 ENCOUNTER — Encounter: Payer: Self-pay | Admitting: Obstetrics and Gynecology

## 2019-11-20 ENCOUNTER — Ambulatory Visit: Payer: BC Managed Care – PPO | Admitting: Obstetrics and Gynecology

## 2019-11-20 ENCOUNTER — Encounter: Payer: BC Managed Care – PPO | Admitting: Gynecology

## 2019-11-20 ENCOUNTER — Other Ambulatory Visit: Payer: Self-pay

## 2019-11-20 VITALS — BP 120/74 | Ht 68.0 in | Wt 162.0 lb

## 2019-11-20 DIAGNOSIS — Z30431 Encounter for routine checking of intrauterine contraceptive device: Secondary | ICD-10-CM | POA: Diagnosis not present

## 2019-11-20 DIAGNOSIS — Z01419 Encounter for gynecological examination (general) (routine) without abnormal findings: Secondary | ICD-10-CM | POA: Diagnosis not present

## 2019-11-20 DIAGNOSIS — R1032 Left lower quadrant pain: Secondary | ICD-10-CM | POA: Diagnosis not present

## 2019-11-20 NOTE — Progress Notes (Signed)
Connie Fox 07/05/1970 [id number]  SUBJECTIVE:  49 y.o. G45P0020 female for annual routine gynecologic exam.  She has been having an occasional left lower quadrant pain a time or 2 most days, random in onset not related to activities, can last a few minutes before residing on its own.  Chronically constipated with bowel movements every other day which is her baseline.  No pain with defecation.  She has a Mirena IUD in place. She has no gynecologic concerns.  Retired from [employer].  Current Outpatient Medications  Medication Sig Dispense Refill  . levonorgestrel (MIRENA) 20 MCG/24HR IUD 1 each by Intrauterine route once.    . Multiple Vitamin (MULTIVITAMIN) tablet Take 1 tablet by mouth daily.    . sertraline (ZOLOFT) 100 MG tablet Take 100 mg by mouth daily.     No current facility-administered medications for this visit.   Allergies: Patient has no known allergies.  No LMP recorded. (Menstrual status: IUD).  Past medical history,surgical history, problem list, medications, allergies, family history and social history were all reviewed and documented as reviewed in the EPIC chart.  ROS:  Feeling well. No dyspnea or chest pain on exertion.  No abdominal pain, change in bowel habits, black or bloody stools.  No urinary tract symptoms. GYN ROS: no abnormal bleeding, pelvic pain or discharge, no breast pain or new or enlarging lumps on self exam. No neurological complaints.    OBJECTIVE:  BP 120/74   Ht 5\' 8"  (1.727 m)   Wt 162 lb (73.5 kg)   BMI 24.63 kg/m  The patient appears well, alert, oriented x 3, in no distress. ENT normal.  Neck supple. No cervical or supraclavicular adenopathy or thyromegaly.  Lungs are clear, good air entry, no wheezes, rhonchi or rales. S1 and S2 normal, no murmurs, regular rate and rhythm.  Abdomen soft without tenderness, guarding, mass or organomegaly.  Neurological is normal, no focal findings.  BREAST EXAM: Bilateral breast implants noted,  breasts appear normal, no suspicious masses, no skin or nipple changes or axillary nodes  PELVIC EXAM: VULVA: normal appearing vulva with no masses, tenderness or lesions, VAGINA: normal appearing vagina with normal color and discharge, no lesions, CERVIX: normal appearing cervix without discharge or lesions, IUD string visualized, UTERUS: uterus is slightly enlarged in size with prominence on the right side which feels like a small fundal fibroid, shape, consistency and nontender, ADNEXA: normal adnexa in size, nontender and no masses  Chaperone: [name] present during the examination  ASSESSMENT:  49 y.o. [id number] here for annual gynecologic exam  PLAN:   1.  Mirena IUD 12/2015.  Doing well without menses.  IUD string visualized and palpated on exam.  IUD is due for removal next year, we discussed possibility of checking [place] level to help determine next course of action in regards to replacement of IUD. 2. Pap smear/HPV 10/2018.  No significant history of abnormal Pap smears.  Next Pap smear due 2025 following the current guidelines recommending the 5 year interval. 3.  Left lower quadrant pain.  No adnexal tenderness or left-sided pain on exam.  She does have a prominence of the right uterine fundus which makes me think she has a right-sided fibroid but this would probably not be related to the left lower quadrant pain.  It would explain her dysmenorrhea.  Also could be bowel related given the chronic constipation.  She will monitor the pain and if it gets more persistent and bothersome then I would recommend she contact us  so we can arrange a pelvic ultrasound. 4. Mammogram 10/2019, report awaiting to be scanned in, she was told the results were normal by the imaging department.  Normal breast exam today.  Continue with annual mammograms. 5. Colonoscopy is recommended for routine colon cancer screening.  Providers and contact information are available upon checkout. 6. Health maintenance.  No  labs today as she normally has these completed elsewhere.  Return annually or sooner, prn.  Joseph Pierini MD 11/20/19

## 2020-11-23 ENCOUNTER — Encounter: Payer: BC Managed Care – PPO | Admitting: Obstetrics and Gynecology

## 2020-11-23 ENCOUNTER — Other Ambulatory Visit: Payer: Self-pay

## 2020-11-23 ENCOUNTER — Ambulatory Visit (INDEPENDENT_AMBULATORY_CARE_PROVIDER_SITE_OTHER): Payer: BC Managed Care – PPO | Admitting: Obstetrics & Gynecology

## 2020-11-23 ENCOUNTER — Encounter: Payer: Self-pay | Admitting: Obstetrics & Gynecology

## 2020-11-23 VITALS — BP 120/76 | Ht 67.0 in | Wt 166.0 lb

## 2020-11-23 DIAGNOSIS — Z30431 Encounter for routine checking of intrauterine contraceptive device: Secondary | ICD-10-CM | POA: Diagnosis not present

## 2020-11-23 DIAGNOSIS — N951 Menopausal and female climacteric states: Secondary | ICD-10-CM

## 2020-11-23 DIAGNOSIS — Z01419 Encounter for gynecological examination (general) (routine) without abnormal findings: Secondary | ICD-10-CM

## 2020-11-23 NOTE — Progress Notes (Signed)
Connie Fox 1970-10-02 [id number]   History:    50 y.o. G2P0A2  RP:  Established patient presenting for annual gyn exam   HPI: Mirena IUD 12/2015.  Doing well without menses.  Occasional mild hot flushes and night sweats.  No pelvic pain. No pain with IC.  Pap smear/HPV 10/2018.  Breasts normal, s/p bilateral augmentation.  Mammogram 11/22/2020.   Colonoscopy 04/2020, benign polyp. BMI 26.  Swimmer.  Health labs with Fam MD.  Past medical history,surgical history, family history and social history were all reviewed and documented in the EPIC chart.  Gynecologic History No LMP recorded. (Menstrual status: IUD).  Obstetric History OB History  Gravida Para Term Preterm AB Living  2 0     2 0  SAB IAB Ectopic Multiple Live Births    2          # Outcome Date GA Lbr Len/2nd Weight Sex Delivery Anes PTL Lv  2 IAB           1 IAB              ROS: A ROS was performed and pertinent positives and negatives are included in the history.  GENERAL: No fevers or chills. HEENT: No change in vision, no earache, sore throat or sinus congestion. NECK: No pain or stiffness. CARDIOVASCULAR: No chest pain or pressure. No palpitations. PULMONARY: No shortness of breath, cough or wheeze. GASTROINTESTINAL: No abdominal pain, nausea, vomiting or diarrhea, melena or bright red blood per rectum. GENITOURINARY: No urinary frequency, urgency, hesitancy or dysuria. MUSCULOSKELETAL: No joint or muscle pain, no back pain, no recent trauma. DERMATOLOGIC: No rash, no itching, no lesions. ENDOCRINE: No polyuria, polydipsia, no heat or cold intolerance. No recent change in weight. HEMATOLOGICAL: No anemia or easy bruising or bleeding. NEUROLOGIC: No headache, seizures, numbness, tingling or weakness. PSYCHIATRIC: No depression, no loss of interest in normal activity or change in sleep pattern.     Exam:   BP 120/76   Ht 5\' 7"  (1.702 m)   Wt 166 lb (75.3 kg)   BMI 26.00 kg/m   Body mass index is 26  kg/m.  General appearance : Well developed well nourished female. No acute distress HEENT: Eyes: no retinal hemorrhage or exudates,  Neck supple, trachea midline, no carotid bruits, no thyroidmegaly Lungs: Clear to auscultation, no rhonchi or wheezes, or rib retractions  Heart: Regular rate and rhythm, no murmurs or gallops Breast:Examined in sitting and supine position were symmetrical in appearance, no palpable masses or tenderness,  no skin retraction, no nipple inversion, no nipple discharge, no skin discoloration, no axillary or supraclavicular lymphadenopathy Abdomen: no palpable masses or tenderness, no rebound or guarding Extremities: no edema or skin discoloration or tenderness  Pelvic: Vulva: Normal             Vagina: No gross lesions or discharge  Cervix: No gross lesions or discharge.  [facility]UD strings felt at [facility].  Uterus  AV, normal size, shape and consistency, non-tender and mobile  Adnexa  Without masses or tenderness  Anus: Normal   Assessment/Plan:  50 y.o. female for annual exam   1. Well female exam with routine gynecological exam Normal gynecologic exam.  Pap test June 2020 was negative, will repeat at 3 years next year.  Breast exam status post bilateral augmentation normal.  Screening mammogram done yesterday at [facility], will obtain report.  Colonoscopy December 2021 with a benign polyp.  Body mass index 26.  Will get back to more  regular swimming.  Continue with healthy nutrition.  Health labs with family physician.  2. IUD check up Well on Mirena IUD x 12/2015.  IUD in good location.  Will keep in place x 7 yrs per new recommendations.  3. Perimenopause Will check [place] level today to evaluate menopausal status.  Recommend continuing on contraception 2 years into menopause. [facility]   Princess Bruins MD, 9:36 AM 11/23/2020

## 2020-11-24 LAB — FOLLICLE STIMULATING HORMONE: FSH: 48.4 m[IU]/mL

## 2021-01-27 ENCOUNTER — Other Ambulatory Visit: Payer: Self-pay | Admitting: Internal Medicine

## 2021-01-27 ENCOUNTER — Ambulatory Visit
Admission: RE | Admit: 2021-01-27 | Discharge: 2021-01-27 | Disposition: A | Discharge status: Home / Self Care | Payer: BC Managed Care – PPO | Source: Ambulatory Visit | Attending: Internal Medicine | Admitting: Internal Medicine

## 2021-01-27 DIAGNOSIS — Z720 Tobacco use: Secondary | ICD-10-CM

## 2021-11-24 ENCOUNTER — Ambulatory Visit: Payer: BC Managed Care – PPO | Admitting: Obstetrics & Gynecology

## 2021-11-30 ENCOUNTER — Other Ambulatory Visit (HOSPITAL_COMMUNITY)
Admission: RE | Admit: 2021-11-30 | Discharge: 2021-11-30 | Disposition: A | Discharge status: Home / Self Care | Payer: BC Managed Care – PPO | Source: Ambulatory Visit | Attending: Obstetrics & Gynecology | Admitting: Obstetrics & Gynecology

## 2021-11-30 ENCOUNTER — Ambulatory Visit (INDEPENDENT_AMBULATORY_CARE_PROVIDER_SITE_OTHER): Payer: BC Managed Care – PPO | Admitting: Obstetrics & Gynecology

## 2021-11-30 ENCOUNTER — Encounter: Payer: Self-pay | Admitting: Obstetrics & Gynecology

## 2021-11-30 VITALS — BP 114/80 | HR 79 | Ht 67.25 in | Wt 165.0 lb

## 2021-11-30 DIAGNOSIS — Z01419 Encounter for gynecological examination (general) (routine) without abnormal findings: Secondary | ICD-10-CM

## 2021-11-30 DIAGNOSIS — N951 Menopausal and female climacteric states: Secondary | ICD-10-CM | POA: Diagnosis not present

## 2021-11-30 DIAGNOSIS — Z30431 Encounter for routine checking of intrauterine contraceptive device: Secondary | ICD-10-CM | POA: Diagnosis not present

## 2021-11-30 LAB — FOLLICLE STIMULATING HORMONE: FSH: 109.8 m[IU]/mL

## 2021-11-30 NOTE — Progress Notes (Signed)
Connie Fox Nov 14, 1970 [id number]   History:    51 y.o.  G2P0A2   RP:  Established patient presenting for annual gyn exam    HPI: Mirena IUD 12/2015.  Doing well without menses.  Frequent hot flushes and night sweats.  No pelvic pain. No pain with IC.  No h/o abnormal Pap.  Pap smear/HPV Neg 10/2018.  Pap reflex today.  Breasts normal, s/p bilateral augmentation.  Mammogram 11/29/21 pending results.   Colonoscopy 04/2020, benign polyp. BMI 25.65 stable.  Swimmer.  Health labs with Fam MD.   Past medical history,surgical history, family history and social history were all reviewed and documented in the EPIC chart.  Gynecologic History No LMP recorded. (Menstrual status: IUD).  Obstetric History OB History  Gravida Para Term Preterm AB Living  2 0     2 0  SAB IAB Ectopic Multiple Live Births    2          # Outcome Date GA Lbr Len/2nd Weight Sex Delivery Anes PTL Lv  2 IAB           1 IAB              ROS: A ROS was performed and pertinent positives and negatives are included in the history. GENERAL: No fevers or chills. HEENT: No change in vision, no earache, sore throat or sinus congestion. NECK: No pain or stiffness. CARDIOVASCULAR: No chest pain or pressure. No palpitations. PULMONARY: No shortness of breath, cough or wheeze. GASTROINTESTINAL: No abdominal pain, nausea, vomiting or diarrhea, melena or bright red blood per rectum. GENITOURINARY: No urinary frequency, urgency, hesitancy or dysuria. MUSCULOSKELETAL: No joint or muscle pain, no back pain, no recent trauma. DERMATOLOGIC: No rash, no itching, no lesions. ENDOCRINE: No polyuria, polydipsia, no heat or cold intolerance. No recent change in weight. HEMATOLOGICAL: No anemia or easy bruising or bleeding. NEUROLOGIC: No headache, seizures, numbness, tingling or weakness. PSYCHIATRIC: No depression, no loss of interest in normal activity or change in sleep pattern.     Exam:   BP 114/80   Pulse 79   Ht 5' 7.25" (1.708 m)    Wt 165 lb (74.8 kg)   SpO2 99%   BMI 25.65 kg/m   Body mass index is 25.65 kg/m.  General appearance : Well developed well nourished female. No acute distress HEENT: Eyes: no retinal hemorrhage or exudates,  Neck supple, trachea midline, no carotid bruits, no thyroidmegaly Lungs: Clear to auscultation, no rhonchi or wheezes, or rib retractions  Heart: Regular rate and rhythm, no murmurs or gallops Breast:Examined in sitting and supine position were symmetrical in appearance, no palpable masses or tenderness,  no skin retraction, no nipple inversion, no nipple discharge, no skin discoloration, no axillary or supraclavicular lymphadenopathy Abdomen: no palpable masses or tenderness, no rebound or guarding Extremities: no edema or skin discoloration or tenderness  Pelvic: Vulva: Normal             Vagina: No gross lesions or discharge  Cervix: No gross lesions or discharge.  IUD strings visible at the EO.  Pap reflex done.  Uterus  AV, normal size, shape and consistency, non-tender and mobile  Adnexa  Without masses or tenderness  Anus: Normal   Assessment/Plan:  51 y.o. female for annual exam   1. Encounter for routine gynecological examination with Papanicolaou smear of cervix Mirena IUD 12/2015.  Doing well without menses.  Frequent hot flushes and night sweats.  No pelvic pain. No pain with IC.  No h/o abnormal Pap.  Pap smear/HPV Neg 10/2018.  Pap reflex today.  Breasts normal, s/p bilateral augmentation.  Mammogram 11/29/21 pending results.   Colonoscopy 04/2020, benign polyp. BMI 25.65 stable.  Swimmer.  Health labs with Fam MD. - Cytology - PAP( [place])  2. Hot flushes, perimenopausal Probably menopausal with vasomotor symptoms, especially night sweats and frequent hot flushes.  Will check [place] today.  Prefers phyto-Estrogens if menopause status confirmed.  Ashwagandha 1 tab PO daily discussed. - [place]  3. IUD check up Mirena IUD 12/2015.  Doing well without menses.  IUD in  good position.  Will keep in place until 8 yrs from insertion in 2025.  Other orders - acyclovir (ZOVIRAX) 800 MG tablet; Take 800 mg by mouth 3 (three) times daily. - ALPRAZolam (XANAX) 0.25 MG tablet; Take 0.25-0.5 mg by mouth every 6 (six) hours as needed.   Princess Bruins MD, 9:56 AM 11/30/2021

## 2021-12-01 ENCOUNTER — Encounter: Payer: Self-pay | Admitting: Obstetrics & Gynecology

## 2021-12-01 LAB — CYTOLOGY - PAP
Adequacy: ABSENT
Diagnosis: NEGATIVE

## 2022-02-06 ENCOUNTER — Other Ambulatory Visit: Payer: Self-pay | Admitting: Internal Medicine

## 2022-02-06 ENCOUNTER — Ambulatory Visit
Admission: RE | Admit: 2022-02-06 | Discharge: 2022-02-06 | Disposition: A | Discharge status: Home / Self Care | Payer: BC Managed Care – PPO | Source: Ambulatory Visit | Attending: Internal Medicine | Admitting: Internal Medicine

## 2022-02-06 DIAGNOSIS — Z720 Tobacco use: Secondary | ICD-10-CM

## 2022-12-05 ENCOUNTER — Encounter: Payer: Self-pay | Admitting: Obstetrics & Gynecology

## 2022-12-05 ENCOUNTER — Ambulatory Visit (INDEPENDENT_AMBULATORY_CARE_PROVIDER_SITE_OTHER): Payer: BC Managed Care – PPO | Admitting: Obstetrics & Gynecology

## 2022-12-05 VITALS — BP 128/82 | HR 74 | Ht 68.5 in | Wt 174.0 lb

## 2022-12-05 DIAGNOSIS — Z30431 Encounter for routine checking of intrauterine contraceptive device: Secondary | ICD-10-CM

## 2022-12-05 DIAGNOSIS — Z01419 Encounter for gynecological examination (general) (routine) without abnormal findings: Secondary | ICD-10-CM

## 2022-12-05 DIAGNOSIS — N951 Menopausal and female climacteric states: Secondary | ICD-10-CM | POA: Diagnosis not present

## 2022-12-05 MED ORDER — ESTRADIOL 0.1 MG/24HR TD PTTW: 1.0000 | MEDICATED_PATCH | TRANSDERMAL | 4 refills | Status: DC

## 2022-12-05 NOTE — Progress Notes (Deleted)
   Connie Fox 05-25-1971 [id number]   History:    52 y.o. G2P0A2   RP:  Established patient presenting for annual gyn exam    HPI: Mirena IUD 12/2015.  Doing well without menses.  Frequent hot flushes and night sweats.  No pelvic pain. No pain with IC.  No h/o abnormal Pap.  Pap Neg 11/2021.  Repeat Pap at 3 years.  Breasts normal, s/p bilateral augmentation.  Mammogram Neg 11/29/21, scheduled 12/06/22. Colonoscopy 04/2020, benign polyp. BMI 26.07. Swimmer.  Health labs with Fam MD.    RP:  *** patient presenting for annual gyn exam   HPI: ***  Past medical history,surgical history, family history and social history were all reviewed and documented in the EPIC chart.  Gynecologic History No LMP recorded. (Menstrual status: IUD).  Obstetric History OB History  Gravida Para Term Preterm AB Living  2 0     2 0  SAB IAB Ectopic Multiple Live Births    2          # Outcome Date GA Lbr Len/2nd Weight Sex Delivery Anes PTL Lv  2 IAB           1 IAB              ROS: A ROS was performed and pertinent positives and negatives are included in the history. GENERAL: No fevers or chills. HEENT: No change in vision, no earache, sore throat or sinus congestion. NECK: No pain or stiffness. CARDIOVASCULAR: No chest pain or pressure. No palpitations. PULMONARY: No shortness of breath, cough or wheeze. GASTROINTESTINAL: No abdominal pain, nausea, vomiting or diarrhea, melena or bright red blood per rectum. GENITOURINARY: No urinary frequency, urgency, hesitancy or dysuria. MUSCULOSKELETAL: No joint or muscle pain, no back pain, no recent trauma. DERMATOLOGIC: No rash, no itching, no lesions. ENDOCRINE: No polyuria, polydipsia, no heat or cold intolerance. No recent change in weight. HEMATOLOGICAL: No anemia or easy bruising or bleeding. NEUROLOGIC: No headache, seizures, numbness, tingling or weakness. PSYCHIATRIC: No depression, no loss of interest in normal activity or change in sleep pattern.      Exam:   BP 128/82 (BP Location: Right Arm, Patient Position: Sitting, Cuff Size: Normal)   Pulse 74   Ht 5' 8.5" (1.74 m)   Wt 174 lb (78.9 kg)   SpO2 98%   BMI 26.07 kg/m   Body mass index is 26.07 kg/m.  General appearance : Well developed well nourished female. No acute distress HEENT: Eyes: no retinal hemorrhage or exudates,  Neck supple, trachea midline, no carotid bruits, no thyroidmegaly Lungs: Clear to auscultation, no rhonchi or wheezes, or rib retractions  Heart: Regular rate and rhythm, no murmurs or gallops Breast:Examined in sitting and supine position were symmetrical in appearance, no palpable masses or tenderness,  no skin retraction, no nipple inversion, no nipple discharge, no skin discoloration, no axillary or supraclavicular lymphadenopathy Abdomen: no palpable masses or tenderness, no rebound or guarding Extremities: no edema or skin discoloration or tenderness  Pelvic: Vulva: Normal             Vagina: No gross lesions or discharge  Cervix: No gross lesions or discharge  Uterus  ***, normal size, shape and consistency, non-tender and mobile  Adnexa  Without masses or tenderness  Anus: ***   Assessment/Plan:  52 y.o. female for annual exam   ***  Genia Del MD, 8:09 AM

## 2022-12-05 NOTE — Progress Notes (Signed)
Connie Fox 01-Dec-1970 [id number]   History:    52 y.o. G2P0A2 Married  RP:  Established patient presenting for annual gyn exam   HPI: Postmenopausal with severe hot flushes and night sweats x >1 year.  Mirena IUD 12/2015.  No PMB.  No pelvic pain.  Abstinent x a few years because of low libido.  No h/o abnormal Pap.  Pap Neg 11/2021.  Repeat Pap at 3 years.  Breasts normal, s/p bilateral augmentation.   Mammogram Neg 11/29/21, scheduled 12/06/22. Colonoscopy 04/2020, benign polyp, repeat at 5 years. BMI 26.07. Swimmer.  Health labs with Fam MD.    Past medical history,surgical history, family history and social history were all reviewed and documented in the EPIC chart.  Gynecologic History No LMP recorded. (Menstrual status: IUD).  Obstetric History OB History  Gravida Para Term Preterm AB Living  2 0     2 0  SAB IAB Ectopic Multiple Live Births    2          # Outcome Date GA Lbr Len/2nd Weight Sex Delivery Anes PTL Lv  2 IAB           1 IAB              ROS: A ROS was performed and pertinent positives and negatives are included in the history. GENERAL: No fevers or chills. HEENT: No change in vision, no earache, sore throat or sinus congestion. NECK: No pain or stiffness. CARDIOVASCULAR: No chest pain or pressure. No palpitations. PULMONARY: No shortness of breath, cough or wheeze. GASTROINTESTINAL: No abdominal pain, nausea, vomiting or diarrhea, melena or bright red blood per rectum. GENITOURINARY: No urinary frequency, urgency, hesitancy or dysuria. MUSCULOSKELETAL: No joint or muscle pain, no back pain, no recent trauma. DERMATOLOGIC: No rash, no itching, no lesions. ENDOCRINE: No polyuria, polydipsia, no heat or cold intolerance. No recent change in weight. HEMATOLOGICAL: No anemia or easy bruising or bleeding. NEUROLOGIC: No headache, seizures, numbness, tingling or weakness. PSYCHIATRIC: No depression, no loss of interest in normal activity or change in sleep pattern.      Exam:   BP 128/82 (BP Location: Right Arm, Patient Position: Sitting, Cuff Size: Normal)   Pulse 74   Ht 5' 8.5" (1.74 m)   Wt 174 lb (78.9 kg)   SpO2 98%   BMI 26.07 kg/m   Body mass index is 26.07 kg/m.  General appearance : Well developed well nourished female. No acute distress HEENT: Eyes: no retinal hemorrhage or exudates,  Neck supple, trachea midline, no carotid bruits, no thyroidmegaly Lungs: Clear to auscultation, no rhonchi or wheezes, or rib retractions  Heart: Regular rate and rhythm, no murmurs or gallops Breast:Examined in sitting and supine position were symmetrical in appearance, no palpable masses or tenderness,  no skin retraction, no nipple inversion, no nipple discharge, no skin discoloration, no axillary or supraclavicular lymphadenopathy Abdomen: no palpable masses or tenderness, no rebound or guarding Extremities: no edema or skin discoloration or tenderness  Pelvic: Vulva: Normal             Vagina: No gross lesions or discharge  Cervix: No gross lesions or discharge.  [facility] at [facility].  Uterus  AV, normal size, shape and consistency, non-tender and mobile  Adnexa  Without masses or tenderness  Anus: Normal   Assessment/Plan:  52 y.o. female for annual exam   1. Well female exam with routine gynecological exam Postmenopausal with severe hot flushes and night sweats x >1 year.  Mirena IUD 12/2015.  No PMB.  No pelvic pain.  Abstinent x a few years because of low libido.  No h/o abnormal Pap.  Pap Neg 11/2021.  Repeat Pap at 3 years.  Breasts normal, s/p bilateral augmentation.   Mammogram Neg 11/29/21, scheduled 12/06/22. Colonoscopy 04/2020, benign polyp, repeat at 5 years. BMI 26.07. Swimmer.  Health labs with Fam MD.  2. Menopausal syndrome Postmenopausal with severe hot flushes and night sweats x >1 year.  Mirena IUD 12/2015.  No PMB.  No pelvic pain.  Abstinent x a few years because of low libido.  Requesting to start on HRT.  No CI.  Counseling  done on Risks of stroke/Breast Ca and Benefits on vasomotor Sxs/Bones/Sexual function.  Decision to start on Estradiol patch 0.1 twice a week and keep the Mirena IUD for Progesterone protection until 12/2023.  Will then remove the IUD and start on Progesterone 100 mg PO HS.  Estradiol patch prescription sent to pharmacy.  3. Encounter for routine checking of intrauterine contraceptive device (IUD) Mirena IUD 12/2015.  IUD in good position.  Other orders - buPROPion (WELLBUTRIN XL) 150 MG 24 hr tablet - estradiol (VIVELLE-DOT) 0.1 MG/24HR patch; Place 1 patch (0.1 mg total) onto the skin 2 (two) times a week.   Genia Del MD, 8:30 AM

## 2023-12-06 LAB — HM MAMMOGRAPHY

## 2023-12-09 NOTE — Progress Notes (Signed)
 53 y.o. G50P0020 female with hx of anxiety and depression, vasomotor symptoms on HRT, Mirena  IUD (inserted August 2017) here for annual exam. Married. Retired from [employer].  No LMP recorded. (Menstrual status: IUD).   She would like to discuss HRTs going forward after removal of IUD. Hot flashes improved with estrogen patch. Diagnosed with depression at age 27.  She has been on depressant since.  She notes she has waves in her mood and she has been feeling more anxious over the past 2 weeks.  She has good support from her husband.  Continue estrogen patch, will add nightly Prometrium   Urine sample provided: Yes  Abnormal bleeding: none Pelvic discharge or pain: none Breast mass, nipple discharge or skin changes : none  Sexually active: Not currently Birth control: IUD Last PAP:     Component Value Date/Time   DIAGPAP  11/30/2021 1011    - Negative for intraepithelial lesion or malignancy (NILM)   ADEQPAP  11/30/2021 1011    Satisfactory for evaluation; transformation zone component ABSENT.   Last mammogram: Per pt, she had one done in 2024, and is currently scheduled for 12/13/23 at [facility], records released signed Last colonoscopy: Colonoscopy 04/2020, benign polyp, repeat at 5 years.   Exercising: Yes, swimming 3 days a week Smoker: No  Flowsheet Row Office Visit from 12/10/2023 in [facility]  PHQ-2 Total Score 4  PHQ-9: 14, never. On SSRI and xanax PRN rx'd by PCP Flowsheet Row Office Visit from 12/10/2023 in [facility]  PHQ-9 Total Score 14     GYN HISTORY: Left breast biopsy x2, benign  OB History  Gravida Para Term Preterm AB Living  2 0   2 0  SAB IAB Ectopic Multiple Live Births   2       # Outcome Date GA Lbr Len/2nd Weight Sex Type Anes PTL Lv  2 IAB           1 IAB            Past Medical History:  Diagnosis Date   Anxiety    Depression    HSV-1 infection    Ovarian cyst    Past Surgical  History:  Procedure Laterality Date   AUGMENTATION MAMMAPLASTY  2014   BREAST BIOPSY     INTRAUTERINE DEVICE INSERTION  12/28/2015   Mirena    Current Outpatient Medications on File Prior to Visit  Medication Sig Dispense Refill   acyclovir (ZOVIRAX) 800 MG tablet Take 800 mg by mouth 3 (three) times daily.     ALPRAZolam (XANAX) 0.25 MG tablet Take 0.25-0.5 mg by mouth every 6 (six) hours as needed.     Multiple Vitamin (MULTIVITAMIN) tablet Take 1 tablet by mouth daily.     sertraline (ZOLOFT) 100 MG tablet Take 100 mg by mouth daily.     No current facility-administered medications on file prior to visit.   Social History   Socioeconomic History   Marital status: Married    Spouse name: Not on file   Number of children: Not on file   Years of education: Not on file   Highest education level: Not on file  Occupational History   Not on file  Tobacco Use   Smoking status: Every Day    Types: Cigarettes   Smokeless tobacco: Never  Vaping Use   Vaping status: Never Used  Substance and Sexual Activity   Alcohol use: Not Currently   Drug use: No  Sexual activity: Not Currently    Comment: Mirena  12/28/2015   1st intercourse 15 yo-5 partners  Other Topics Concern   Not on file  Social History Narrative   Not on file   Social Drivers of Health   Financial Resource Strain: Not on file  Food Insecurity: Not on file  Transportation Needs: Not on file  Physical Activity: Not on file  Stress: Not on file  Social Connections: Not on file  Intimate Partner Violence: Not on file   Family History  Problem Relation Age of Onset   Breast cancer Mother 79       post hysterectomy   Diabetes Paternal Aunt    Diabetes Maternal Grandfather    Breast cancer Paternal Grandmother        Post menopausal   Cancer Father        Glioblastoma   Allergies  Allergen Reactions   Chantix [Varenicline]     Other reaction(s): depression     PE Today's Vitals   12/10/23 0805  BP:  112/62  Pulse: 72  Temp: 98.2 F (36.8 C)  TempSrc: Oral  SpO2: 98%  Weight: 175 lb (79.4 kg)  Height: 5' 8 (1.727 m)   Body mass index is 26.61 kg/m.  Physical Exam Vitals reviewed. Exam conducted with a chaperone present.  Constitutional:      General: She is not in acute distress.    Appearance: Normal appearance.  HENT:     Head: Normocephalic and atraumatic.     Nose: Nose normal.  Eyes:     Extraocular Movements: Extraocular movements intact.     Conjunctiva/sclera: Conjunctivae normal.  Neck:     Thyroid: No thyroid mass, thyromegaly or thyroid tenderness.  Pulmonary:     Effort: Pulmonary effort is normal.  Chest:     Chest wall: No mass or tenderness.  Breasts:    Right: Normal. No swelling, mass, nipple discharge, skin change or tenderness.     Left: Normal. No swelling, mass, nipple discharge, skin change or tenderness.     Comments: +implants Abdominal:     General: There is no distension.     Palpations: Abdomen is soft.     Tenderness: There is no abdominal tenderness.  Genitourinary:    General: Normal vulva.     Exam position: Lithotomy position.     Urethra: No prolapse.     Vagina: Normal. No vaginal discharge or bleeding.     Cervix: Normal. No cervical motion tenderness, discharge or lesion.     Uterus: Normal. Not enlarged and not tender.      Adnexa: Right adnexa normal and left adnexa normal.     Comments: IUD strings present. Musculoskeletal:        General: Normal range of motion.     Cervical back: Normal range of motion.  Lymphadenopathy:     Upper Body:     Right upper body: No axillary adenopathy.     Left upper body: No axillary adenopathy.     Lower Body: No right inguinal adenopathy. No left inguinal adenopathy.  Skin:    General: Skin is warm and dry.  Neurological:     General: No focal deficit present.     Mental Status: She is alert.  Psychiatric:        Mood and Affect: Mood normal.        Behavior: Behavior normal.      IUD removal procedure: Consent was signed. Timeout was performed. Speculum inserted. Cervix visualized,  IUD grasps with forceps. IUD did not expelled with gentle traction. Increased traction was applied and it was removed intact. Pt tolerated procedure well.     Assessment and Plan:        Well woman exam with routine gynecological exam Assessment & Plan: Cervical cancer screening performed according to ASCCP guidelines. Encouraged annual mammogram screening Colonoscopy due 2026 DXA N/A Labs and immunizations with her primary Encouraged safe sexual practices as indicated Encouraged healthy lifestyle practices with diet and exercise For patients under 50-70yo, I recommend 1200mg  calcium daily and 600IU of vitamin D daily.  Encounter for IUD removal Uncomplicated  Vasomotor symptoms due to menopause Assessment & Plan: Continue estrogen patch, will add nightly Prometrium  -     Progesterone ; Take 1 capsule (100 mg total) by mouth daily.  Dispense: 90 capsule; Refill: 3 -     Estradiol ; Place 1 patch (0.1 mg total) onto the skin 2 (two) times a week.  Dispense: 24 patch; Refill: 4  Anxiety and depression +depression screen with moderate symptoms. F/u with PCP  Vera LULLA Pa, MD

## 2023-12-10 ENCOUNTER — Ambulatory Visit (INDEPENDENT_AMBULATORY_CARE_PROVIDER_SITE_OTHER): Payer: Self-pay | Admitting: Obstetrics and Gynecology

## 2023-12-10 ENCOUNTER — Encounter: Payer: Self-pay | Admitting: Obstetrics and Gynecology

## 2023-12-10 VITALS — BP 112/62 | HR 72 | Temp 98.2°F | Ht 68.0 in | Wt 175.0 lb

## 2023-12-10 DIAGNOSIS — Z30432 Encounter for removal of intrauterine contraceptive device: Secondary | ICD-10-CM | POA: Diagnosis not present

## 2023-12-10 DIAGNOSIS — F32A Depression, unspecified: Secondary | ICD-10-CM

## 2023-12-10 DIAGNOSIS — F419 Anxiety disorder, unspecified: Secondary | ICD-10-CM | POA: Diagnosis not present

## 2023-12-10 DIAGNOSIS — Z1331 Encounter for screening for depression: Secondary | ICD-10-CM | POA: Diagnosis not present

## 2023-12-10 DIAGNOSIS — Z01419 Encounter for gynecological examination (general) (routine) without abnormal findings: Secondary | ICD-10-CM | POA: Insufficient documentation

## 2023-12-10 DIAGNOSIS — N951 Menopausal and female climacteric states: Secondary | ICD-10-CM | POA: Diagnosis not present

## 2023-12-10 MED ORDER — ESTRADIOL 0.1 MG/24HR TD PTTW: 1.0000 | MEDICATED_PATCH | TRANSDERMAL | 4 refills | Status: AC

## 2023-12-10 MED ORDER — PROGESTERONE MICRONIZED 100 MG PO CAPS: 100.0000 mg | ORAL_CAPSULE | Freq: Every day | ORAL | 3 refills | Status: AC

## 2023-12-10 NOTE — Patient Instructions (Signed)

## 2023-12-10 NOTE — Assessment & Plan Note (Signed)
 Continue estrogen patch, will add nightly Prometrium 

## 2023-12-10 NOTE — Assessment & Plan Note (Signed)
 Cervical cancer screening performed according to ASCCP guidelines. Encouraged annual mammogram screening Colonoscopy due 2026 DXA N/A Labs and immunizations with her primary Encouraged safe sexual practices as indicated Encouraged healthy lifestyle practices with diet and exercise For patients under 50-54yo, I recommend 1200mg  calcium daily and 600IU of vitamin D daily.

## 2023-12-17 ENCOUNTER — Ambulatory Visit: Payer: Self-pay | Admitting: Obstetrics and Gynecology
